# Patient Record
Sex: Female | Born: 1970
Health system: Southern US, Community
[De-identification: ages and names within clinical notes are randomized; demographics above are authoritative.]

## PROBLEM LIST (undated history)

## (undated) DIAGNOSIS — N920 Excessive and frequent menstruation with regular cycle: Secondary | ICD-10-CM

## (undated) DIAGNOSIS — R002 Palpitations: Secondary | ICD-10-CM

## (undated) DIAGNOSIS — Z862 Personal history of diseases of the blood and blood-forming organs and certain disorders involving the immune mechanism: Secondary | ICD-10-CM

## (undated) DIAGNOSIS — E66812 Obesity, class 2: Secondary | ICD-10-CM

## (undated) DIAGNOSIS — F32A Depression, unspecified: Secondary | ICD-10-CM

## (undated) DIAGNOSIS — E669 Obesity, unspecified: Secondary | ICD-10-CM

## (undated) DIAGNOSIS — R7303 Prediabetes: Secondary | ICD-10-CM

## (undated) DIAGNOSIS — H6981 Other specified disorders of Eustachian tube, right ear: Secondary | ICD-10-CM

## (undated) DIAGNOSIS — F329 Major depressive disorder, single episode, unspecified: Secondary | ICD-10-CM

## (undated) DIAGNOSIS — D1803 Hemangioma of intra-abdominal structures: Secondary | ICD-10-CM

## (undated) DIAGNOSIS — F419 Anxiety disorder, unspecified: Secondary | ICD-10-CM

## (undated) HISTORY — DX: Major depressive disorder, single episode, unspecified: F32.9

## (undated) HISTORY — DX: Anxiety disorder, unspecified: F41.9

## (undated) HISTORY — DX: Other specified disorders of eustachian tube, right ear: H69.81

## (undated) HISTORY — DX: Obesity, unspecified: E66.9

## (undated) HISTORY — PX: COLONOSCOPY: SHX174

## (undated) HISTORY — DX: Personal history of diseases of the blood and blood-forming organs and certain disorders involving the immune mechanism: Z86.2

## (undated) HISTORY — DX: Excessive and frequent menstruation with regular cycle: N92.0

## (undated) HISTORY — DX: Hemangioma of intra-abdominal structures: D18.03

## (undated) HISTORY — DX: Palpitations: R00.2

## (undated) HISTORY — PX: BUNIONECTOMY: SHX129

## (undated) HISTORY — DX: Prediabetes: R73.03

## (undated) HISTORY — DX: Depression, unspecified: F32.A

## (undated) HISTORY — DX: Obesity, class 2: E66.812

## (undated) HISTORY — PX: POLYPECTOMY: SHX149

---
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
## 1997-10-03 ENCOUNTER — Other Ambulatory Visit: Admission: RE | Admit: 1997-10-03 | Discharge: 1997-10-03 | Payer: Self-pay | Admitting: Obstetrics & Gynecology

## 1997-11-01 ENCOUNTER — Other Ambulatory Visit: Admission: RE | Admit: 1997-11-01 | Discharge: 1997-11-01 | Payer: Self-pay | Admitting: Obstetrics & Gynecology

## 1998-12-09 ENCOUNTER — Inpatient Hospital Stay (HOSPITAL_COMMUNITY): Admission: AD | Admit: 1998-12-09 | Discharge: 1998-12-12 | Payer: Self-pay | Admitting: Obstetrics and Gynecology

## 1998-12-14 ENCOUNTER — Encounter (HOSPITAL_COMMUNITY): Admission: RE | Admit: 1998-12-14 | Discharge: 1999-03-14 | Payer: Self-pay | Admitting: Obstetrics and Gynecology

## 1999-06-10 HISTORY — PX: TUBAL LIGATION: SHX77

## 2000-03-26 ENCOUNTER — Other Ambulatory Visit: Admission: RE | Admit: 2000-03-26 | Discharge: 2000-03-26 | Payer: Self-pay | Admitting: Obstetrics & Gynecology

## 2000-04-14 ENCOUNTER — Ambulatory Visit (HOSPITAL_COMMUNITY): Admission: RE | Admit: 2000-04-14 | Discharge: 2000-04-14 | Payer: Self-pay | Admitting: Obstetrics & Gynecology

## 2002-05-02 ENCOUNTER — Other Ambulatory Visit: Admission: RE | Admit: 2002-05-02 | Discharge: 2002-05-02 | Payer: Self-pay | Admitting: Obstetrics & Gynecology

## 2004-01-22 ENCOUNTER — Other Ambulatory Visit: Admission: RE | Admit: 2004-01-22 | Discharge: 2004-01-22 | Payer: Self-pay | Admitting: Obstetrics & Gynecology

## 2010-12-19 ENCOUNTER — Other Ambulatory Visit: Payer: Self-pay | Admitting: Obstetrics & Gynecology

## 2010-12-19 DIAGNOSIS — R928 Other abnormal and inconclusive findings on diagnostic imaging of breast: Secondary | ICD-10-CM

## 2010-12-23 ENCOUNTER — Ambulatory Visit
Admission: RE | Admit: 2010-12-23 | Discharge: 2010-12-23 | Disposition: A | Discharge status: Home / Self Care | Payer: BC Managed Care – PPO | Source: Ambulatory Visit | Attending: Obstetrics & Gynecology | Admitting: Obstetrics & Gynecology

## 2010-12-23 DIAGNOSIS — R928 Other abnormal and inconclusive findings on diagnostic imaging of breast: Secondary | ICD-10-CM

## 2012-04-06 ENCOUNTER — Ambulatory Visit (INDEPENDENT_AMBULATORY_CARE_PROVIDER_SITE_OTHER): Payer: BC Managed Care – PPO | Admitting: Internal Medicine

## 2012-04-06 ENCOUNTER — Encounter: Payer: Self-pay | Admitting: Internal Medicine

## 2012-04-06 VITALS — BP 114/80 | HR 81 | Temp 98.1°F | Resp 16 | Ht 65.0 in | Wt 183.0 lb

## 2012-04-06 DIAGNOSIS — Z23 Encounter for immunization: Secondary | ICD-10-CM

## 2012-04-06 DIAGNOSIS — M549 Dorsalgia, unspecified: Secondary | ICD-10-CM

## 2012-04-06 DIAGNOSIS — R109 Unspecified abdominal pain: Secondary | ICD-10-CM

## 2012-04-06 DIAGNOSIS — E785 Hyperlipidemia, unspecified: Secondary | ICD-10-CM | POA: Insufficient documentation

## 2012-04-06 LAB — CBC WITH DIFFERENTIAL/PLATELET
Basophils Absolute: 0.1 10*3/uL (ref 0.0–0.1)
Basophils Relative: 1 % (ref 0–1)
Eosinophils Relative: 1 % (ref 0–5)
HCT: 42.7 % (ref 36.0–46.0)
Hemoglobin: 14.8 g/dL (ref 12.0–15.0)
MCH: 32.4 pg (ref 26.0–34.0)
MCHC: 34.7 g/dL (ref 30.0–36.0)
MCV: 93.4 fL (ref 78.0–100.0)
Monocytes Absolute: 0.6 10*3/uL (ref 0.1–1.0)
Monocytes Relative: 7 % (ref 3–12)
Neutro Abs: 4.7 10*3/uL (ref 1.7–7.7)
RDW: 14.5 % (ref 11.5–15.5)

## 2012-04-06 LAB — BASIC METABOLIC PANEL
BUN: 10 mg/dL (ref 6–23)
Creat: 0.94 mg/dL (ref 0.50–1.10)
Glucose, Bld: 83 mg/dL (ref 70–99)
Potassium: 4.8 mEq/L (ref 3.5–5.3)

## 2012-04-06 LAB — LIPID PANEL
Cholesterol: 225 mg/dL — ABNORMAL HIGH (ref 0–200)
Total CHOL/HDL Ratio: 4.5 Ratio
Triglycerides: 161 mg/dL — ABNORMAL HIGH (ref <150)

## 2012-04-06 LAB — HEPATIC FUNCTION PANEL
ALT: 16 U/L (ref 0–35)
AST: 18 U/L (ref 0–37)
Albumin: 4.5 g/dL (ref 3.5–5.2)
Bilirubin, Direct: 0.1 mg/dL (ref 0.0–0.3)
Total Bilirubin: 0.5 mg/dL (ref 0.3–1.2)

## 2012-04-06 NOTE — Assessment & Plan Note (Signed)
Unremarkable exam. No radicular symptoms. Pursue GI etiology first

## 2012-04-06 NOTE — Progress Notes (Signed)
  Subjective:    Patient ID: Gabriella Stephens, female    DOB: 1970/08/15, 41 y.o.   MRN: [id number]  HPI patient presents to clinic to establish primary care and for evaluation of multiple medical problems. Notes approximately one month history of abdominal pain that she initially thought was related to left low thoracic vs. upper left lumbar pain. Has no radicular symptoms down the leg. She did in the beginning fillet the pain radiated to the front of the abdomen. Now has more persistent left abdominal pain both periumbilical and upper. No radiating abdominal pain nausea vomiting or change in bowel habits. Believes the pain may become worse with food. Taking no medication for the problem. Denies true GERD symptoms or dysphagia.  Recalls a past history of hyperlipidemia last checked approximately 2 years ago. Has not required medication for this. States she obtains her mammograms through gynecology and is due for both followups. States will call and make her appointment. No other complaints.  History reviewed. No pertinent past medical history. Past Surgical History  Procedure Date  . Cesarean section x 2  . Bunionectomy     right foot  . Tonsillectomy     as a child    reports that she has been smoking Cigarettes.  She has a 20 pack-year smoking history. She has never used smokeless tobacco. She reports that she drinks alcohol. Her drug history not on file. family history includes Cancer in her mother; Diabetes in her mother; Heart disease in her mother; Hyperlipidemia in her father and mother; Hypertension in her father and mother; and Thyroid disease in her mother.  There is no history of Kidney disease, and Asthma, and Stroke, . Allergies  Allergen Reactions  . Penicillins Hives     Review of Systems  Gastrointestinal: Positive for abdominal pain. Negative for nausea and vomiting.  Musculoskeletal: Positive for back pain.  All other systems reviewed and are negative.         Objective:   Physical Exam  Nursing note and vitals reviewed. Constitutional: She appears well-developed and well-nourished. No distress.  HENT:  Head: Normocephalic and atraumatic.  Right Ear: External ear normal.  Left Ear: External ear normal.  Nose: Nose normal.  Mouth/Throat: Oropharynx is clear and moist. No oropharyngeal exudate.  Eyes: Conjunctivae normal and EOM are normal. Pupils are equal, round, and reactive to light. No scleral icterus.  Neck: Neck supple. Carotid bruit is not present. No thyromegaly present.  Cardiovascular: Normal rate, regular rhythm and normal heart sounds.  Exam reveals no gallop and no friction rub.   No murmur heard. Pulmonary/Chest: Effort normal and breath sounds normal. No respiratory distress. She has no wheezes. She has no rales.  Abdominal: Soft. Normal appearance and bowel sounds are normal. She exhibits no distension and no mass. There is no hepatosplenomegaly. There is tenderness in the epigastric area, periumbilical area and left upper quadrant. There is no rebound and no guarding.  Lymphadenopathy:    She has no cervical adenopathy.  Neurological: She is alert.  Skin: Skin is warm and dry. She is not diaphoretic.  Psychiatric: She has a normal mood and affect.  MSK: no midline ls tenderness/bony abn or paraspinal muscle spasm/tenderness. Gait nl        Assessment & Plan:

## 2012-04-06 NOTE — Assessment & Plan Note (Signed)
Obtain fasting lipid profile 

## 2012-04-06 NOTE — Assessment & Plan Note (Signed)
With associated tenderness. Worsened postprandially. History exam suggests gastric etiology. Obtain CBC, Chem-7, amylase, LFT and urinalysis. Begin samples of Nexium 40 mg daily. Followup in 3 weeks or sooner if necessary.

## 2012-04-07 LAB — URINALYSIS, ROUTINE W REFLEX MICROSCOPIC
Hgb urine dipstick: NEGATIVE
Ketones, ur: NEGATIVE mg/dL
Nitrite: NEGATIVE
Urobilinogen, UA: 0.2 mg/dL (ref 0.0–1.0)
pH: 7 (ref 5.0–8.0)

## 2012-04-14 ENCOUNTER — Telehealth: Payer: Self-pay | Admitting: *Deleted

## 2012-04-14 NOTE — Telephone Encounter (Signed)
Didn't notify because has f/u appt coming up. Regular labs were nl. Chol was mildly high. Low fat diet/exercise. Will go over at f/u

## 2012-04-14 NOTE — Telephone Encounter (Signed)
Pt requesting results on labs/SLS

## 2012-04-15 NOTE — Telephone Encounter (Signed)
LMOM with contact name & number RE: results & provider instructions/SLS

## 2012-04-29 ENCOUNTER — Encounter: Payer: Self-pay | Admitting: Internal Medicine

## 2012-04-29 ENCOUNTER — Ambulatory Visit (INDEPENDENT_AMBULATORY_CARE_PROVIDER_SITE_OTHER): Payer: BC Managed Care – PPO | Admitting: Internal Medicine

## 2012-04-29 VITALS — BP 116/76 | HR 76 | Temp 98.2°F | Resp 16 | Wt 185.0 lb

## 2012-04-29 DIAGNOSIS — Z720 Tobacco use: Secondary | ICD-10-CM | POA: Insufficient documentation

## 2012-04-29 DIAGNOSIS — E785 Hyperlipidemia, unspecified: Secondary | ICD-10-CM

## 2012-04-29 DIAGNOSIS — R52 Pain, unspecified: Secondary | ICD-10-CM

## 2012-04-29 DIAGNOSIS — R1013 Epigastric pain: Secondary | ICD-10-CM

## 2012-04-29 DIAGNOSIS — R109 Unspecified abdominal pain: Secondary | ICD-10-CM

## 2012-04-29 DIAGNOSIS — F17200 Nicotine dependence, unspecified, uncomplicated: Secondary | ICD-10-CM

## 2012-04-29 MED ORDER — VARENICLINE TARTRATE 1 MG PO TABS: 1.0000 mg | ORAL_TABLET | Freq: Two times a day (BID) | ORAL | Status: DC

## 2012-04-29 MED ORDER — VARENICLINE TARTRATE 0.5 MG X 11 & 1 MG X 42 PO MISC: ORAL | Status: DC

## 2012-04-29 NOTE — Assessment & Plan Note (Signed)
Extend course of Nexium 40 mg a day-samples provided for an additional 15 days. Has demonstrated some improvement but no resolution of pain or tenderness after one month of PPI. Proceed with GI consultation for consideration of EGD.

## 2012-04-29 NOTE — Assessment & Plan Note (Signed)
Ready to proceed with cessation. Attempt Chantix.

## 2012-04-29 NOTE — Assessment & Plan Note (Signed)
Recommend low-fat diet and regular aerobic exercise. Repeat lipid profile with next visit

## 2012-04-29 NOTE — Progress Notes (Signed)
  Subjective:    Patient ID: Gabriella Stephens, female    DOB: 1971/03/19, 41 y.o.   MRN: [id number]  HPI Pt presents to clinic for followup of multiple medical problems. Abdominal pain has improved with Nexium but not resolved. Pain is less severe and not constant. Tolerating Nexium samples without difficulty. Current tobacco user but wishes cessation. Previously tolerated Chantix. Has work wellness form to be completed. Reviewed mildly elevated cholesterol.  No past medical history on file. Past Surgical History  Procedure Date  . Cesarean section x 2  . Bunionectomy     right foot  . Tonsillectomy     as a child    reports that she has been smoking Cigarettes.  She has a 20 pack-year smoking history. She has never used smokeless tobacco. She reports that she drinks alcohol. Her drug history not on file. family history includes Cancer in her mother; Diabetes in her mother; Heart disease in her mother; Hyperlipidemia in her father and mother; Hypertension in her father and mother; and Thyroid disease in her mother.  There is no history of Kidney disease, and Asthma, and Stroke, . Allergies  Allergen Reactions  . Penicillins Hives      Review of Systems see hpi     Objective:   Physical Exam  Nursing note and vitals reviewed. Constitutional: She appears well-developed and well-nourished. No distress.  HENT:  Head: Normocephalic and atraumatic.  Abdominal: Soft. Bowel sounds are normal. She exhibits no distension and no mass. There is tenderness. There is no rebound and no guarding.       Mild discomfort with palpation epigastric and left upper quadrant.  Skin: She is not diaphoretic.          Assessment & Plan:

## 2012-04-29 NOTE — Patient Instructions (Signed)
Please schedule fasting labs prior to next visit Lipid-272.4 

## 2012-05-13 ENCOUNTER — Encounter: Payer: Self-pay | Admitting: Internal Medicine

## 2012-05-27 ENCOUNTER — Ambulatory Visit (INDEPENDENT_AMBULATORY_CARE_PROVIDER_SITE_OTHER): Payer: BC Managed Care – PPO | Admitting: Internal Medicine

## 2012-05-27 ENCOUNTER — Encounter: Payer: Self-pay | Admitting: Internal Medicine

## 2012-05-27 VITALS — BP 128/78 | HR 87 | Wt 191.2 lb

## 2012-05-27 DIAGNOSIS — R1013 Epigastric pain: Secondary | ICD-10-CM

## 2012-05-27 NOTE — Patient Instructions (Addendum)
You have been scheduled for an endoscopy with propofol. Please follow written instructions given to you at your visit today. If you use inhalers (even only as needed) or a CPAP machine, please bring them with you on the day of your procedure.  You have been scheduled for an abdominal ultrasound at [facility] (1st floor of hospital) on 05/31/2012 at 3:00. Please arrive 15 minutes prior to your appointment for registration. Make certain not to have anything to eat or drink 6 hours prior to your appointment. Should you need to reschedule your appointment, please contact radiology at [phone]. This test typically takes about 30 minutes to perform.

## 2012-05-27 NOTE — Progress Notes (Signed)
Patient ID: Gabriella Stephens, female   DOB: 11-18-1970, 41 y.o.   MRN: [id number]  SUBJECTIVE: HPI Gabriella Stephens is a 41 year old female with little past medical history who seen in consultation at the request of Dr. Rodena Medin for evaluation of epigastric abdominal pain. The patient states that her epigastric pain started around September 2013. Initially she was given a prescription for Nexium which seems to help the pain, but then it seemed to lose its efficacy. The pain migrated slightly more inferior to just above her umbilicus. This feels more like an 8 to her but is tolerable. She feels that it is worse with eating, usually within 15-20 minutes. She's not having nocturnal symptoms. She denies heartburn, dysphagia or odynophagia. She has noticed increase in belching. Her appetite has been good without early satiety. Bowel habits have been normal for her which is approximately every other day, formed and brown. No rectal bleeding or melena. No diarrhea.  She has recently stopped smoking as of 05/10/2012, having smoked on and off for 20 years. She was taking Chantix but this seemed to worsen her abdominal pain, and she discontinued it.  Review of Systems  As per history of present illness, otherwise negative   History reviewed. No pertinent past medical history.  No current outpatient prescriptions on file.    Allergies  Allergen Reactions  . Penicillins Hives    Family History  Problem Relation Age of Onset  . Cancer Mother     breast  . Thyroid disease Mother   . Hypertension Mother   . Diabetes Mother   . Heart disease Mother   . Hyperlipidemia Mother   . Hypertension Father   . Hyperlipidemia Father   . Kidney disease Neg Hx   . Asthma Neg Hx   . Stroke Neg Hx     History  Substance Use Topics  . Smoking status: Former Smoker -- 1.0 packs/day for 20 years    Types: Cigarettes  . Smokeless tobacco: Never Used  . Alcohol Use: No     Comment: seldom; once a month     OBJECTIVE: BP 128/78  Pulse 87  Wt 191 lb 3.2 oz (86.728 kg)  SpO2 98%  LMP 04/28/2012 Constitutional: Well-developed and well-nourished. No distress. HEENT: Normocephalic and atraumatic. Oropharynx is clear and moist. No oropharyngeal exudate. Conjunctivae are normal. No scleral icterus. Neck: Neck supple. Trachea midline. Cardiovascular: Normal rate, regular rhythm and intact distal pulses. No M/R/G Pulmonary/chest: Effort normal and breath sounds normal. No wheezing, rales or rhonchi. Abdominal: Soft, mild epigastric tenderness without rebound or guarding, nondistended. Bowel sounds active throughout. There are no masses palpable. No hepatosplenomegaly. Extremities: no clubbing, cyanosis, or edema Lymphadenopathy: No cervical adenopathy noted. Neurological: Alert and oriented to person place and time. Skin: Skin is warm and dry. No rashes noted. Psychiatric: Normal mood and affect. Behavior is normal.  Labs and Imaging -- CBC    Component Value Date/Time   WBC 7.9 04/06/2012 0853   RBC 4.57 04/06/2012 0853   HGB 14.8 04/06/2012 0853   HCT 42.7 04/06/2012 0853   PLT 394 04/06/2012 0853   MCV 93.4 04/06/2012 0853   MCH 32.4 04/06/2012 0853   MCHC 34.7 04/06/2012 0853   RDW 14.5 04/06/2012 0853   LYMPHSABS 2.5 04/06/2012 0853   MONOABS 0.6 04/06/2012 0853   EOSABS 0.1 04/06/2012 0853   BASOSABS 0.1 04/06/2012 0853    CMP     Component Value Date/Time   NA 140 04/06/2012 0853   K 4.8  04/06/2012 0853   CL 103 04/06/2012 0853   CO2 27 04/06/2012 0853   GLUCOSE 83 04/06/2012 0853   BUN 10 04/06/2012 0853   CREATININE 0.94 04/06/2012 0853   CALCIUM 9.7 04/06/2012 0853   PROT 7.5 04/06/2012 0853   ALBUMIN 4.5 04/06/2012 0853   AST 18 04/06/2012 0853   ALT 16 04/06/2012 0853   ALKPHOS 54 04/06/2012 0853   BILITOT 0.5 04/06/2012 0853   Amylase    Component Value Date/Time   AMYLASE 34 04/06/2012 0853    ASSESSMENT AND PLAN: 41 year old female with little  past medical history who seen in consultation at the request of Dr. Rodena Medin for evaluation of epigastric abdominal pain and mid abdominal pain.  1.  Epigastric and mid abdominal pain -- the patient's pain has been ongoing now for several months in the differential includes acid peptic disease such as ulcers or gastritis duodenitis, biliary disease, and less likely celiac disease.  Her symptoms seem primarily upper and she's had no change in her bowel habits or alarming lower GI symptoms.  For now I recommend abdominal ultrasound to better evaluate the gallbladder.  I've also recommended upper endoscopy for direct visualization and to exclude ulcer disease and H. pylori infection.  She was offered medications to help with pain, which she declines. If her pains worsens then she can be offered tramadol, while the diagnostic workup is ongoing. Time was provided for questions and answers, and she is agreeable to this plan.

## 2012-05-31 ENCOUNTER — Ambulatory Visit (AMBULATORY_SURGERY_CENTER): Payer: BC Managed Care – PPO | Admitting: Internal Medicine

## 2012-05-31 ENCOUNTER — Ambulatory Visit (HOSPITAL_COMMUNITY)
Admission: RE | Admit: 2012-05-31 | Discharge: 2012-05-31 | Disposition: A | Discharge status: Home / Self Care | Payer: BC Managed Care – PPO | Source: Ambulatory Visit | Attending: Internal Medicine | Admitting: Internal Medicine

## 2012-05-31 ENCOUNTER — Encounter: Payer: Self-pay | Admitting: Internal Medicine

## 2012-05-31 VITALS — BP 125/78 | HR 65 | Temp 98.7°F | Resp 24 | Ht 65.0 in | Wt 191.0 lb

## 2012-05-31 DIAGNOSIS — D131 Benign neoplasm of stomach: Secondary | ICD-10-CM

## 2012-05-31 DIAGNOSIS — R1013 Epigastric pain: Secondary | ICD-10-CM | POA: Insufficient documentation

## 2012-05-31 MED ORDER — SODIUM CHLORIDE 0.9 % IV SOLN: 500.0000 mL | INTRAVENOUS | Status: DC

## 2012-05-31 MED ORDER — PANTOPRAZOLE SODIUM 40 MG PO TBEC: 40.0000 mg | DELAYED_RELEASE_TABLET | Freq: Every day | ORAL | Status: DC

## 2012-05-31 NOTE — Progress Notes (Signed)
Called to room to assist during endoscopic procedure.  Patient ID and intended procedure confirmed with present staff. Received instructions for my participation in the procedure from the performing physician.  

## 2012-05-31 NOTE — Progress Notes (Signed)
Patient did not have preoperative order for IV antibiotic SSI prophylaxis. (G8918)  Patient did not experience any of the following events: a burn prior to discharge; a fall within the facility; wrong site/side/patient/procedure/implant event; or a hospital transfer or hospital admission upon discharge from the facility. (G8907)  

## 2012-05-31 NOTE — Patient Instructions (Addendum)
YOU HAD AN ENDOSCOPIC PROCEDURE TODAY AT THE [facility]: Refer to the procedure report that was given to you for any specific questions about what was found during the examination.  If the procedure report does not answer your questions, please call your gastroenterologist to clarify.  If you requested that your care partner not be given the details of your procedure findings, then the procedure report has been included in a sealed envelope for you to review at your convenience later.  YOU SHOULD EXPECT: Some feelings of bloating in the abdomen. Passage of more gas than usual.  Walking can help get rid of the air that was put into your GI tract during the procedure and reduce the bloating. If you had a lower endoscopy (such as a colonoscopy or flexible sigmoidoscopy) you may notice spotting of blood in your stool or on the toilet paper. If you underwent a bowel prep for your procedure, then you may not have a normal bowel movement for a few days.  DIET: Your first meal following the procedure should be a light meal and then it is ok to progress to your normal diet.  A half-sandwich or bowl of soup is an example of a good first meal.  Heavy or fried foods are harder to digest and may make you feel nauseous or bloated.  Likewise meals heavy in dairy and vegetables can cause extra gas to form and this can also increase the bloating.  Drink plenty of fluids but you should avoid alcoholic beverages for 24 hours.  ACTIVITY: Your care partner should take you home directly after the procedure.  You should plan to take it easy, moving slowly for the rest of the day.  You can resume normal activity the day after the procedure however you should NOT DRIVE or use heavy machinery for 24 hours (because of the sedation medicines used during the test).    SYMPTOMS TO REPORT IMMEDIATELY: A gastroenterologist can be reached at any hour.  During normal business hours, 8:30 AM to 5:00 PM Monday through Friday,  call [phone].  After hours and on weekends, please call the GI answering service at [phone] who will take a message and have the physician on call contact you.   Following lower endoscopy (colonoscopy or flexible sigmoidoscopy):  Excessive amounts of blood in the stool  Significant tenderness or worsening of abdominal pains  Swelling of the abdomen that is new, acute  Fever of 100F or higher  Following upper endoscopy (EGD)  Vomiting of blood or coffee ground material  New chest pain or pain under the shoulder blades  Painful or persistently difficult swallowing  New shortness of breath  Fever of 100F or higher  Black, tarry-looking stools  FOLLOW UP: If any biopsies were taken you will be contacted by phone or by letter within the next 1-3 weeks.  Call your gastroenterologist if you have not heard about the biopsies in 3 weeks.  Our staff will call the home number listed on your records the next business day following your procedure to check on you and address any questions or concerns that you may have at that time regarding the information given to you following your procedure. This is a courtesy call and so if there is no answer at the home number and we have not heard from you through the emergency physician on call, we will assume that you have returned to your regular daily activities without incident.  SIGNATURES/CONFIDENTIALITY: You and/or your care   partner have signed paperwork which will be entered into your electronic medical record.  These signatures attest to the fact that that the information above on your After Visit Summary has been reviewed and is understood.  Full responsibility of the confidentiality of this discharge information lies with you and/or your care-partner.  

## 2012-05-31 NOTE — Op Note (Signed)
[facility] [address] [place], [address]   ENDOSCOPY PROCEDURE REPORT  PATIENT: Gabriella, Stephens  MR#: [id number] BIRTHDATE: 15-Mar-1971 , 41  yrs. old GENDER: Female ENDOSCOPIST: Beverley Fiedler, MD REFERRED BY:  Charlynn Court PROCEDURE DATE:  05/31/2012 PROCEDURE:  EGD w/ biopsy for H.pylori ASA CLASS:     Class II INDICATIONS:  Epigastric pain. MEDICATIONS: MAC sedation, administered by CRNA and propofol (Diprivan) 300mg  IV TOPICAL ANESTHETIC: Cetacaine Spray  DESCRIPTION OF PROCEDURE: After the risks benefits and alternatives of the procedure were thoroughly explained, informed consent was obtained.  The LB-GIF Q180 [id number] endoscope was introduced through the mouth and advanced to the second portion of the duodenum. Without limitations.  The instrument was slowly withdrawn as the mucosa was fully examined.    ESOPHAGUS: The mucosa of the esophagus appeared normal.  STOMACH: There was mild antral gastropathy noted.  Cold forcep biopsies were taken at the antrum and angularis.   Otherwise normal appearing gastric mucosa.  DUODENUM: Mild duodenal inflammation was found in the duodenal bulb. The duodenal mucosa showed no abnormalities in the 2nd part of the duodenum. Retroflexed views revealed no abnormalities.     The scope was then withdrawn from the patient and the procedure completed.  COMPLICATIONS: There were no complications. ENDOSCOPIC IMPRESSION: 1.   The mucosa of the esophagus appeared normal 2.   There was mild antral gastropathy noted 3.   Mild duodenal inflammation was found in the duodenal bulb 4.   The duodenal mucosa showed no abnormalities in the 2nd part of the duodenum  RECOMMENDATIONS: 1.  Await biopsy results 2.  Follow-up of helicobacter pylori status, treat if indicated 3.  Trial of pantoprazole 40 mg daily, best taken 30 minutes before your first meal 4.  Proceed with previously order abdominal ultrasound  eSigned:   Beverley Fiedler, MD 05/31/2012 12:04 PMrevised

## 2012-05-31 NOTE — Progress Notes (Signed)
No egg or soy allergy. ewm 

## 2012-06-01 ENCOUNTER — Telehealth: Payer: Self-pay | Admitting: *Deleted

## 2012-06-01 NOTE — Telephone Encounter (Signed)
  Follow up Call-  Call back number 05/31/2012  Post procedure Call Back phone  # [phone]  Permission to leave phone message Yes     Patient questions:  Do you have a fever, pain , or abdominal swelling? no Pain Score  0 *  Have you tolerated food without any problems? yes  Have you been able to return to your normal activities? yes  Do you have any questions about your discharge instructions: Diet   no Medications  no Follow up visit  no  Do you have questions or concerns about your Care? no  Actions: * If pain score is 4 or above: No action needed, pain <4.

## 2012-06-10 ENCOUNTER — Encounter: Payer: Self-pay | Admitting: Internal Medicine

## 2012-08-24 ENCOUNTER — Ambulatory Visit: Payer: BC Managed Care – PPO | Admitting: Internal Medicine

## 2012-10-18 ENCOUNTER — Telehealth: Payer: Self-pay | Admitting: *Deleted

## 2012-10-18 NOTE — Telephone Encounter (Signed)
Message copied by Florene Glen on Mon Oct 18, 2012 10:36 AM ------      Message from: Selinda Michaels R      Created: Tue Jun 01, 2012  9:13 AM      Regarding: Repeat abdominal ultrasound       Hemangioma-needs [place] in May or June ------

## 2012-10-18 NOTE — Telephone Encounter (Signed)
lmom for pt to call back

## 2012-11-08 NOTE — Telephone Encounter (Signed)
lmom for pt to call back

## 2012-11-11 NOTE — Telephone Encounter (Signed)
Pt never called back. Mailed her a letter asking her to contact us to schedule an U/S.

## 2012-12-07 ENCOUNTER — Telehealth: Payer: Self-pay | Admitting: *Deleted

## 2012-12-07 NOTE — Telephone Encounter (Signed)
Message copied by Florene Glen on Tue Dec 07, 2012  9:04 AM ------      Message from: Florene Glen      Created: Thu Nov 11, 2012 10:51 AM       See if pt called to schedule u/s ------

## 2012-12-13 NOTE — Telephone Encounter (Signed)
Mailed pt a letter on 11/11/12 and have not heard from pt nor has she scheduled an U/S.

## 2012-12-27 NOTE — Telephone Encounter (Signed)
Pt never called back.

## 2013-04-14 ENCOUNTER — Other Ambulatory Visit: Payer: Self-pay

## 2014-03-13 ENCOUNTER — Other Ambulatory Visit: Payer: Self-pay | Admitting: Obstetrics & Gynecology

## 2014-03-14 LAB — CYTOLOGY - PAP

## 2014-03-15 ENCOUNTER — Other Ambulatory Visit: Payer: Self-pay | Admitting: Obstetrics & Gynecology

## 2014-03-15 DIAGNOSIS — R928 Other abnormal and inconclusive findings on diagnostic imaging of breast: Secondary | ICD-10-CM

## 2014-03-23 ENCOUNTER — Ambulatory Visit
Admission: RE | Admit: 2014-03-23 | Discharge: 2014-03-23 | Disposition: A | Discharge status: Home / Self Care | Payer: BC Managed Care – PPO | Source: Ambulatory Visit | Attending: Obstetrics & Gynecology | Admitting: Obstetrics & Gynecology

## 2014-03-23 DIAGNOSIS — R928 Other abnormal and inconclusive findings on diagnostic imaging of breast: Secondary | ICD-10-CM

## 2015-04-24 ENCOUNTER — Encounter: Payer: Self-pay | Admitting: Family Medicine

## 2015-04-24 ENCOUNTER — Ambulatory Visit (INDEPENDENT_AMBULATORY_CARE_PROVIDER_SITE_OTHER): Payer: BLUE CROSS/BLUE SHIELD | Admitting: Family Medicine

## 2015-04-24 VITALS — BP 136/81 | HR 74 | Temp 98.2°F | Resp 16 | Ht 62.25 in | Wt 206.0 lb

## 2015-04-24 DIAGNOSIS — Z23 Encounter for immunization: Secondary | ICD-10-CM | POA: Diagnosis not present

## 2015-04-24 DIAGNOSIS — Z0000 Encounter for general adult medical examination without abnormal findings: Secondary | ICD-10-CM

## 2015-04-24 DIAGNOSIS — D229 Melanocytic nevi, unspecified: Secondary | ICD-10-CM

## 2015-04-24 LAB — TSH: TSH: 3.21 u[IU]/mL (ref 0.35–4.50)

## 2015-04-24 LAB — CBC WITH DIFFERENTIAL/PLATELET
BASOS PCT: 0.6 % (ref 0.0–3.0)
Basophils Absolute: 0 10*3/uL (ref 0.0–0.1)
EOS PCT: 1.2 % (ref 0.0–5.0)
Eosinophils Absolute: 0.1 10*3/uL (ref 0.0–0.7)
HCT: 41.1 % (ref 36.0–46.0)
Hemoglobin: 13.6 g/dL (ref 12.0–15.0)
LYMPHS ABS: 2.1 10*3/uL (ref 0.7–4.0)
Lymphocytes Relative: 33.7 % (ref 12.0–46.0)
MCHC: 33.2 g/dL (ref 30.0–36.0)
MCV: 93.4 fl (ref 78.0–100.0)
Monocytes Absolute: 0.4 10*3/uL (ref 0.1–1.0)
Monocytes Relative: 6.3 % (ref 3.0–12.0)
NEUTROS PCT: 58.2 % (ref 43.0–77.0)
Neutro Abs: 3.7 10*3/uL (ref 1.4–7.7)
Platelets: 390 10*3/uL (ref 150.0–400.0)
RBC: 4.4 Mil/uL (ref 3.87–5.11)
RDW: 15 % (ref 11.5–15.5)
WBC: 6.3 10*3/uL (ref 4.0–10.5)

## 2015-04-24 LAB — COMPREHENSIVE METABOLIC PANEL
ALT: 16 U/L (ref 0–35)
AST: 16 U/L (ref 0–37)
Albumin: 4.3 g/dL (ref 3.5–5.2)
Alkaline Phosphatase: 56 U/L (ref 39–117)
BUN: 12 mg/dL (ref 6–23)
CO2: 26 meq/L (ref 19–32)
Calcium: 9.5 mg/dL (ref 8.4–10.5)
Chloride: 104 mEq/L (ref 96–112)
Creatinine, Ser: 0.89 mg/dL (ref 0.40–1.20)
GFR: 72.87 mL/min (ref >60.00)
GLUCOSE: 91 mg/dL (ref 70–99)
POTASSIUM: 4.1 meq/L (ref 3.5–5.1)
Sodium: 139 mEq/L (ref 135–145)
Total Bilirubin: 0.5 mg/dL (ref 0.2–1.2)
Total Protein: 7.2 g/dL (ref 6.0–8.3)

## 2015-04-24 LAB — LIPID PANEL
CHOL/HDL RATIO: 4
Cholesterol: 200 mg/dL (ref 0–200)
HDL: 53 mg/dL (ref >39.00)
LDL Cholesterol: 123 mg/dL — ABNORMAL HIGH (ref 0–99)
NONHDL: 147.35
Triglycerides: 121 mg/dL (ref 0.0–149.0)
VLDL: 24.2 mg/dL (ref 0.0–40.0)

## 2015-04-24 NOTE — Progress Notes (Signed)
Office Note 04/24/2015  CC:  Chief Complaint  Patient presents with  . Establish Care  . Annual Exam    Pt is fasting.     HPI:  Gabriella Stephens is a 44 y.o. White female who is here to establish care, get CPE. Patient's most recent primary MD: Dr. Elizebeth Koller at [facility].  Last saw him a couple years ago. Old records were reviewed prior to or during today's visit. She sees GYN, Dr. Stann Mainland, and is due to f/u with him for annual visit soon.  History reviewed. No pertinent past medical history.  Past Surgical History  Procedure Laterality Date  . Cesarean section  x 2    1995; 2000  . Bunionectomy      right foot  . Tonsillectomy  1973    as a child  . Tubal ligation      Family History  Problem Relation Age of Onset  . Thyroid disease Mother   . Hypertension Mother   . Diabetes Mother   . Heart disease Mother   . Hyperlipidemia Mother   . Breast cancer Mother   . Hypertension Father   . Hyperlipidemia Father   . Multiple myeloma Father   . Kidney disease Neg Hx   . Asthma Neg Hx   . Stroke Neg Hx   . Esophageal cancer Neg Hx   . Rectal cancer Neg Hx   . Stomach cancer Neg Hx   . Colon cancer Paternal Grandfather     Social History   Social History  . Marital Status: Single    Spouse Name: N/A  . Number of Children: 3  . Years of Education: N/A   Occupational History  . Manager   .     Social History Main Topics  . Smoking status: Former Smoker -- 1.00 packs/day for 20 years    Types: Cigarettes    Quit date: 06/10/2011  . Smokeless tobacco: Never Used  . Alcohol Use: No     Comment: seldom; once a month  . Drug Use: No  . Sexual Activity: Not on file   Other Topics Concern  . Not on file   Social History Narrative   Married, 3 children.  Lives in [place].   Educ: HS   Occupation: Environmental health practitioner and print room for [employer]   No T/A/Ds.         MEDS: not currently taking either of the meds listed below Outpatient Encounter  Prescriptions as of 04/24/2015  Medication Sig  . phentermine 37.5 MG capsule Take 37.5 mg by mouth every morning. Rx'd by her GYN, Dr. Stann Mainland.  . [DISCONTINUED] pantoprazole (PROTONIX) 40 MG tablet Take 1 tablet (40 mg total) by mouth daily. (Patient not taking: Reported on 04/24/2015)   No facility-administered encounter medications on file as of 04/24/2015.    Allergies  Allergen Reactions  . Penicillins Hives    ROS Review of Systems  Constitutional: Negative for fever, chills, appetite change and fatigue.  HENT: Negative for congestion, dental problem, ear pain and sore throat.   Eyes: Negative for discharge, redness and visual disturbance.  Respiratory: Negative for cough, chest tightness, shortness of breath and wheezing.   Cardiovascular: Negative for chest pain, palpitations and leg swelling.  Gastrointestinal: Negative for nausea, vomiting, abdominal pain, diarrhea and blood in stool.  Genitourinary: Negative for dysuria, urgency, frequency, hematuria, flank pain and difficulty urinating.  Musculoskeletal: Negative for myalgias, back pain, joint swelling, arthralgias and neck stiffness.  Skin: Negative for  pallor and rash.  Neurological: Negative for dizziness, speech difficulty, weakness and headaches.  Hematological: Negative for adenopathy. Does not bruise/bleed easily.  Psychiatric/Behavioral: Negative for confusion and sleep disturbance. The patient is not nervous/anxious.     PE; Blood pressure 136/81, pulse 74, temperature 98.2 F (36.8 C), temperature source Oral, resp. rate 16, height 5' 2.25" (1.581 m), weight 206 lb (93.441 kg), last menstrual period 04/20/2015, SpO2 97 %. Gen: Alert, well appearing.  Patient is oriented to person, place, time, and situation. AFFECT: pleasant, lucid thought and speech. ENT: Ears: EACs clear, normal epithelium.  TMs with good light reflex and landmarks bilaterally.  Eyes: no injection, icteris, swelling, or exudate.  EOMI,  PERRLA. Nose: no drainage or turbinate edema/swelling.  No injection or focal lesion.  Mouth: lips without lesion/swelling.  Oral mucosa pink and moist.  Dentition intact and without obvious caries or gingival swelling.  Oropharynx without erythema, exudate, or swelling.  Neck: supple/nontender.  No LAD, mass, or TM.  Carotid pulses 2+ bilaterally, without bruits. CV: RRR, no m/r/g.   LUNGS: CTA bilat, nonlabored resps, good aeration in all lung fields. ABD: soft, NT, ND, BS normal.  No hepatospenomegaly or mass.  No bruits. EXT: no clubbing, cyanosis, or edema.  Musculoskeletal: no joint swelling, erythema, warmth, or tenderness.  ROM of all joints intact. Skin - no sores or color changes.  She has a 1 cm oval compound nevus in low back region  Pertinent labs:   Lab Results  Component Value Date   WBC 7.9 04/06/2012   HGB 14.8 04/06/2012   HCT 42.7 04/06/2012   MCV 93.4 04/06/2012   PLT 394 04/06/2012   Lab Results  Component Value Date   CREATININE 0.94 04/06/2012   BUN 10 04/06/2012   NA 140 04/06/2012   K 4.8 04/06/2012   CL 103 04/06/2012   CO2 27 04/06/2012   Lab Results  Component Value Date   ALT 16 04/06/2012   AST 18 04/06/2012   ALKPHOS 54 04/06/2012   BILITOT 0.5 04/06/2012   Lab Results  Component Value Date   CHOL 225* 04/06/2012   Lab Results  Component Value Date   HDL 50 04/06/2012   Lab Results  Component Value Date   LDLCALC 143* 04/06/2012   Lab Results  Component Value Date   TRIG 161* 04/06/2012   Lab Results  Component Value Date   CHOLHDL 4.5 04/06/2012    ASSESSMENT AND PLAN:   Transfer pt;  Health maintenance exam: Reviewed age and gender appropriate health maintenance issues (prudent diet, regular exercise, health risks of tobacco and excessive alcohol, use of seatbelts, fire alarms in home, use of sunscreen).  Also reviewed age and gender appropriate health screening as well as vaccine recommendations. Flu vaccine  today. Fasting HP labs drawn today. Referral to dermatologist ordered today due to atypical nevus on low back region.  An After Visit Summary was printed and given to the patient.  Return in about 1 year (around 04/23/2016) for annual CPE (fasting).

## 2015-04-24 NOTE — Progress Notes (Signed)
Pre visit review using our clinic review tool, if applicable. No additional management support is needed unless otherwise documented below in the visit note. 

## 2015-08-16 ENCOUNTER — Encounter: Payer: Self-pay | Admitting: Family Medicine

## 2015-08-16 ENCOUNTER — Ambulatory Visit (INDEPENDENT_AMBULATORY_CARE_PROVIDER_SITE_OTHER): Payer: 59 | Admitting: Family Medicine

## 2015-08-16 VITALS — BP 119/72 | HR 73 | Temp 98.2°F | Resp 20 | Wt 201.2 lb

## 2015-08-16 DIAGNOSIS — F4323 Adjustment disorder with mixed anxiety and depressed mood: Secondary | ICD-10-CM

## 2015-08-16 MED ORDER — DULOXETINE HCL 30 MG PO CPEP: ORAL_CAPSULE | ORAL | Status: DC

## 2015-08-16 NOTE — Progress Notes (Signed)
OFFICE VISIT  08/16/2015   CC:  Chief Complaint  Patient presents with  . Anxiety     HPI:    Patient is a 45 y.o. Caucasian female who presents for "stress".  Then she actually went on to describe pervasive sadness feeling, crying easily. Her mother has cancer and is not doing well lately, the reality of the situation has caught up with her and she feels down/depressed/anxious all the time.  She starts to cry anytime she thinks about the situation now. Denies SI or HI.  Concentration is ok so she is able to work.  +Irritable.  Energy level and appetite are unchanged.  Sleep is choppy but this in not far from her normal.  No panic attacks.  Says she has never been on antidepressant or antianxiety med in past b/c " I'm not a medication type of person".  She doesn't smoke. No signif alcohol.     Past Medical History  Diagnosis Date  . Obesity, Class II, BMI 35-39.9     Past Surgical History  Procedure Laterality Date  . Cesarean section  x 2    1995; 2000  . Bunionectomy      right foot  . Tonsillectomy  1973    as a child  . Tubal ligation      Allergies  Allergen Reactions  . Penicillins Hives    ROS As per HPI  PE: Blood pressure 119/72, pulse 73, temperature 98.2 F (36.8 C), resp. rate 20, weight 201 lb 4 oz (91.286 kg), SpO2 98 %. Wt Readings from Last 2 Encounters:  08/16/15 201 lb 4 oz (91.286 kg)  04/24/15 206 lb (93.441 kg)    Gen: alert, oriented x 4, affect pleasant.  Lucid thinking and conversation noted. HEENT: PERRLA, EOMI.   Neck: no LAD, mass, or thyromegaly. CV: RRR, no m/r/g LUNGS: CTA bilat, nonlabored. NEURO: no tremor or tics noted on observation.  Coordination intact. CN 2-12 grossly intact bilaterally, strength 5/5 in all extremeties.  No ataxia.   LABS:  none  IMPRESSION AND PLAN:  Adjustment reaction with depression>anxiety symptoms. Discussed options with pt and she wants medication to try to help this. Started duloxetine 30mg   qd x 7d, then increase to 60mg  qd. Therapeutic expectations and side effect profile of medication discussed today.  Patient's questions answered.  An After Visit Summary was printed and given to the patient.  FOLLOW UP: Return in about 4 weeks (around 09/13/2015) for f/u mood/grief.

## 2015-09-10 ENCOUNTER — Other Ambulatory Visit: Payer: Self-pay | Admitting: *Deleted

## 2015-09-10 MED ORDER — DULOXETINE HCL 60 MG PO CPEP
60.0000 mg | ORAL_CAPSULE | Freq: Every day | ORAL | Status: DC
Start: 2015-09-10 — End: 2016-04-24

## 2015-09-10 NOTE — Telephone Encounter (Signed)
RF request for duloxetine LOV: 08/16/15 Next ov: 10/03/15 Last written: 08/16/15 #53 w/ 0RF

## 2015-09-25 ENCOUNTER — Other Ambulatory Visit: Payer: Self-pay | Admitting: Obstetrics & Gynecology

## 2015-09-26 LAB — CYTOLOGY - PAP

## 2015-10-03 ENCOUNTER — Ambulatory Visit: Payer: 59 | Admitting: Family Medicine

## 2016-04-24 ENCOUNTER — Encounter: Payer: Self-pay | Admitting: Family Medicine

## 2016-04-24 ENCOUNTER — Ambulatory Visit (INDEPENDENT_AMBULATORY_CARE_PROVIDER_SITE_OTHER): Payer: 59 | Admitting: Family Medicine

## 2016-04-24 VITALS — BP 138/88 | HR 72 | Temp 98.4°F | Resp 16 | Ht 63.0 in | Wt 212.4 lb

## 2016-04-24 DIAGNOSIS — F419 Anxiety disorder, unspecified: Secondary | ICD-10-CM

## 2016-04-24 DIAGNOSIS — F329 Major depressive disorder, single episode, unspecified: Secondary | ICD-10-CM

## 2016-04-24 DIAGNOSIS — Z23 Encounter for immunization: Secondary | ICD-10-CM

## 2016-04-24 DIAGNOSIS — F418 Other specified anxiety disorders: Secondary | ICD-10-CM

## 2016-04-24 DIAGNOSIS — Z0000 Encounter for general adult medical examination without abnormal findings: Secondary | ICD-10-CM

## 2016-04-24 MED ORDER — DULOXETINE HCL 60 MG PO CPEP: ORAL_CAPSULE | ORAL | 6 refills | Status: DC

## 2016-04-24 NOTE — Progress Notes (Signed)
Pre visit review using our clinic review tool, if applicable. No additional management support is needed unless otherwise documented below in the visit note. 

## 2016-04-24 NOTE — Addendum Note (Signed)
Addended by: Gordy Councilman on: 04/24/2016 09:15 AM   Modules accepted: Orders

## 2016-04-24 NOTE — Progress Notes (Signed)
Office Note 04/24/2016  CC:  Chief Complaint  Patient presents with  . Annual Exam    CPE    HPI:  Gabriella Stephens is a 45 y.o. White female who is here for annual health maintenance exam. GYN MD is Dr. Stann Mainland.  Pap smear and mammogram are UTD. Recent wellness labs via work showed lipid panel normal and fasting glucose normal.  BP normal. We decided to do no further labs today.  These labs will be scanned into pt's chart.  Anxiety: we started duloxetine 08/2015 and she took it for 2 mo and found it helpful.  She then stopped it "b/c I'm not a big medicine person".  However, she wants to restart this med now.     Past Medical History:  Diagnosis Date  . Obesity, Class II, BMI 35-39.9     Past Surgical History:  Procedure Laterality Date  . BUNIONECTOMY     right foot  . CESAREAN SECTION  x 2   1995; 2000  . TONSILLECTOMY  1973   as a child  . TUBAL LIGATION      Family History  Problem Relation Age of Onset  . Thyroid disease Mother   . Hypertension Mother   . Diabetes Mother   . Heart disease Mother   . Hyperlipidemia Mother   . Breast cancer Mother   . Hypertension Father   . Hyperlipidemia Father   . Multiple myeloma Father   . Colon cancer Paternal Grandfather   . Kidney disease Neg Hx   . Asthma Neg Hx   . Stroke Neg Hx   . Esophageal cancer Neg Hx   . Rectal cancer Neg Hx   . Stomach cancer Neg Hx     Social History   Social History  . Marital status: Single    Spouse name: N/A  . Number of children: 3  . Years of education: N/A   Occupational History  . 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  [employer]   Social History Main Topics  . Smoking status: Former Smoker    Packs/day: 1.00    Years: 20.00    Types: Cigarettes    Quit date: 06/10/2011  . Smokeless tobacco: Never Used  . Alcohol use No     Comment: seldom; once a month  . Drug use: No  . Sexual activity: Not on file   Other Topics Concern  . Not on file   Social History Narrative   Married, 3  children.  Lives in [place].   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   No T/A/Ds.         MED: not currently taking cymbalta Outpatient Medications Prior to Visit  Medication Sig Dispense Refill  . DULoxetine (CYMBALTA) 60 MG capsule Take 1 capsule (60 mg total) by mouth daily. 1 cap po qd x 7d, then 2 caps po qd (Patient not taking: Reported on 04/24/2016) 30 capsule 3   No facility-administered medications prior to visit.     Allergies  Allergen Reactions  . Penicillins Hives    ROS Review of Systems  Constitutional: Negative for appetite change, chills, fatigue and fever.  HENT: Negative for congestion, dental problem, ear pain and sore throat.   Eyes: Negative for discharge, redness and visual disturbance.  Respiratory: Negative for cough, chest tightness, shortness of breath and wheezing.   Cardiovascular: Negative for chest pain, palpitations and leg swelling.  Gastrointestinal: Negative for abdominal pain, blood in stool, diarrhea,  nausea and vomiting.  Genitourinary: Negative for difficulty urinating, dysuria, flank pain, frequency, hematuria and urgency.  Musculoskeletal: Negative for arthralgias, back pain, joint swelling, myalgias and neck stiffness.  Skin: Negative for pallor and rash.  Neurological: Negative for dizziness, speech difficulty, weakness and headaches.  Hematological: Negative for adenopathy. Does not bruise/bleed easily.  Psychiatric/Behavioral: Positive for dysphoric mood. Negative for confusion and sleep disturbance. The patient is nervous/anxious.     PE; Blood pressure 138/88, pulse 72, temperature 98.4 F (36.9 C), temperature source Temporal, resp. rate 16, height _0  (1.6 m), weight 212 lb 6.4 oz (96.3 kg), last menstrual period 04/02/2016, SpO2 98 %. Gen: Alert, well appearing.  Patient is oriented to person, place, time, and situation. AFFECT: pleasant, lucid thought and speech. ENT: Ears: EACs  clear, normal epithelium.  TMs with good light reflex and landmarks bilaterally.  Eyes: no injection, icteris, swelling, or exudate.  EOMI, PERRLA. Nose: no drainage or turbinate edema/swelling.  No injection or focal lesion.  Mouth: lips without lesion/swelling.  Oral mucosa pink and moist.  Dentition intact and without obvious caries or gingival swelling.  Oropharynx without erythema, exudate, or swelling.  Neck: supple/nontender.  No LAD, mass, or TM.  Carotid pulses 2+ bilaterally, without bruits. CV: RRR, no m/r/g.   LUNGS: CTA bilat, nonlabored resps, good aeration in all lung fields. ABD: soft, NT, ND, BS normal.  No hepatospenomegaly or mass.  No bruits. EXT: no clubbing, cyanosis, or edema.  Musculoskeletal: no joint swelling, erythema, warmth, or tenderness.  ROM of all joints intact. Skin - no sores or suspicious lesions or rashes or color changes  Pertinent labs:  Lab Results  Component Value Date   TSH 3.21 04/24/2015   Lab Results  Component Value Date   WBC 6.3 04/24/2015   HGB 13.6 04/24/2015   HCT 41.1 04/24/2015   MCV 93.4 04/24/2015   PLT 390.0 04/24/2015   Lab Results  Component Value Date   CREATININE 0.89 04/24/2015   BUN 12 04/24/2015   NA 139 04/24/2015   K 4.1 04/24/2015   CL 104 04/24/2015   CO2 26 04/24/2015   Lab Results  Component Value Date   ALT 16 04/24/2015   AST 16 04/24/2015   ALKPHOS 56 04/24/2015   BILITOT 0.5 04/24/2015   Lab Results  Component Value Date   CHOL 200 04/24/2015   Lab Results  Component Value Date   HDL 53.00 04/24/2015   Lab Results  Component Value Date   LDLCALC 123 (H) 04/24/2015   Lab Results  Component Value Date   TRIG 121.0 04/24/2015   Lab Results  Component Value Date   CHOLHDL 4 04/24/2015    ASSESSMENT AND PLAN:   Health maintenance exam: Reviewed age and gender appropriate health maintenance issues (prudent diet, regular exercise, health risks of tobacco and excessive alcohol, use of  seatbelts, fire alarms in home, use of sunscreen).  Also reviewed age and gender appropriate health screening as well as vaccine recommendations. Flu vaccine today. No labs today since she had a normal lipid panel and fasting glucose in October 2017. Cerv and breast ca screening UTD.  Regarding her GAD, recent depression---will restart cymbalta 60 mg qd at pt's request today.  She said she responded well to this med x 2 mo back in March this year.  An After Visit Summary was printed and given to the patient.  FOLLOW UP:  Return in about 6 months (around 10/22/2016) for f/u anx/dep.  Signed:  Abbe Amsterdam  Roxann Vierra, MD           04/24/2016

## 2016-07-14 ENCOUNTER — Encounter: Payer: Self-pay | Admitting: Family Medicine

## 2016-07-14 ENCOUNTER — Ambulatory Visit (INDEPENDENT_AMBULATORY_CARE_PROVIDER_SITE_OTHER): Payer: BLUE CROSS/BLUE SHIELD | Admitting: Family Medicine

## 2016-07-14 ENCOUNTER — Ambulatory Visit (HOSPITAL_BASED_OUTPATIENT_CLINIC_OR_DEPARTMENT_OTHER)
Admission: RE | Admit: 2016-07-14 | Discharge: 2016-07-14 | Disposition: A | Discharge status: Home / Self Care | Payer: BLUE CROSS/BLUE SHIELD | Source: Ambulatory Visit | Attending: Family Medicine | Admitting: Family Medicine

## 2016-07-14 VITALS — BP 117/79 | HR 84 | Temp 99.2°F | Resp 16 | Wt 213.0 lb

## 2016-07-14 DIAGNOSIS — R1010 Upper abdominal pain, unspecified: Secondary | ICD-10-CM

## 2016-07-14 DIAGNOSIS — I700 Atherosclerosis of aorta: Secondary | ICD-10-CM | POA: Diagnosis not present

## 2016-07-14 DIAGNOSIS — K769 Liver disease, unspecified: Secondary | ICD-10-CM | POA: Diagnosis not present

## 2016-07-14 DIAGNOSIS — R110 Nausea: Secondary | ICD-10-CM | POA: Insufficient documentation

## 2016-07-14 DIAGNOSIS — M545 Low back pain: Secondary | ICD-10-CM | POA: Diagnosis not present

## 2016-07-14 DIAGNOSIS — R3129 Other microscopic hematuria: Secondary | ICD-10-CM

## 2016-07-14 DIAGNOSIS — R918 Other nonspecific abnormal finding of lung field: Secondary | ICD-10-CM | POA: Diagnosis not present

## 2016-07-14 LAB — COMPREHENSIVE METABOLIC PANEL
ALT: 14 U/L (ref 6–29)
AST: 19 U/L (ref 10–35)
Albumin: 3.9 g/dL (ref 3.6–5.1)
Alkaline Phosphatase: 52 U/L (ref 33–115)
BUN: 12 mg/dL (ref 7–25)
CO2: 27 mmol/L (ref 20–31)
Calcium: 8.3 mg/dL — ABNORMAL LOW (ref 8.6–10.2)
Chloride: 103 mmol/L (ref 98–110)
Creat: 0.92 mg/dL (ref 0.50–1.10)
GLUCOSE: 98 mg/dL (ref 65–99)
Potassium: 3.8 mmol/L (ref 3.5–5.3)
Sodium: 138 mmol/L (ref 135–146)
Total Bilirubin: 0.6 mg/dL (ref 0.2–1.2)
Total Protein: 6.9 g/dL (ref 6.1–8.1)

## 2016-07-14 LAB — POC URINALSYSI DIPSTICK (AUTOMATED)
Glucose, UA: NEGATIVE
Ketones, UA: NEGATIVE
Leukocytes, UA: NEGATIVE
Nitrite, UA: NEGATIVE
PH UA: 5.5
Spec Grav, UA: 1.025
Urobilinogen, UA: 4

## 2016-07-14 LAB — CBC WITH DIFFERENTIAL/PLATELET
BASOS PCT: 0 %
Basophils Absolute: 0 cells/uL (ref 0–200)
Eosinophils Absolute: 0 cells/uL — ABNORMAL LOW (ref 15–500)
Eosinophils Relative: 0 %
HCT: 41.3 % (ref 35.0–45.0)
Hemoglobin: 13.7 g/dL (ref 11.7–15.5)
Lymphocytes Relative: 16 %
Lymphs Abs: 1040 cells/uL (ref 850–3900)
MCH: 30.6 pg (ref 27.0–33.0)
MCHC: 33.2 g/dL (ref 32.0–36.0)
MCV: 92.4 fL (ref 80.0–100.0)
MONOS PCT: 8 %
MPV: 8.7 fL (ref 7.5–12.5)
Monocytes Absolute: 520 cells/uL (ref 200–950)
Neutro Abs: 4940 cells/uL (ref 1500–7800)
Neutrophils Relative %: 76 %
PLATELETS: 301 10*3/uL (ref 140–400)
RBC: 4.47 MIL/uL (ref 3.80–5.10)
RDW: 14.5 % (ref 11.0–15.0)
WBC: 6.5 10*3/uL (ref 3.8–10.8)

## 2016-07-14 LAB — LIPASE: Lipase: 11 U/L (ref 7–60)

## 2016-07-14 MED ORDER — PROMETHAZINE HCL 12.5 MG PO TABS: ORAL_TABLET | ORAL | 1 refills | Status: DC

## 2016-07-14 MED ORDER — HYDROCODONE-ACETAMINOPHEN 5-325 MG PO TABS: 1.0000 | ORAL_TABLET | Freq: Four times a day (QID) | ORAL | 0 refills | Status: DC | PRN

## 2016-07-14 NOTE — Progress Notes (Signed)
pocPre visit review using our clinic review tool, if applicable. No additional management support is needed unless otherwise documented below in the visit note. 

## 2016-07-14 NOTE — Progress Notes (Signed)
OFFICE VISIT  07/14/2016   CC:  Chief Complaint  Patient presents with  . Back Pain    lower back pain, radiates to flank and mid back  . Nausea   HPI:    Patient is a 46 y.o. Caucasian female who presents for nausea. Slight L LB pain x 1 week or so, mild.  Got a lot worse and extended across to entire low back and even around R side some the last 24h.  Also started feeling nauseated with the increased pain yesterday.  No vomiting.  Drinking some, feeling tired.  Hard to get warm yesterday, taking ibup regularly.  No dysuria, no urgency or frequency, no gross hematuria.  Mid abdomen does ache constantly.  No diarrhea or constipation. No known sick contacts.  No respiratory symptoms. LMP 06/29/15.  This came when she expected it. Eating makes all her symptoms worse.   Ibuprofen helps some.   Past Medical History:  Diagnosis Date  . Anxiety and depression   . Obesity, Class II, BMI 35-39.9     Past Surgical History:  Procedure Laterality Date  . BUNIONECTOMY     right foot  . CESAREAN SECTION  x 2   1995; 2000  . TONSILLECTOMY  1973   as a child  . TUBAL LIGATION      Outpatient Medications Prior to Visit  Medication Sig Dispense Refill  . DULoxetine (CYMBALTA) 60 MG capsule 1 cap po qd 30 capsule 6   No facility-administered medications prior to visit.     Allergies  Allergen Reactions  . Penicillins Hives    ROS As per HPI  PE: Blood pressure 117/79, pulse 84, temperature 99.2 F (37.3 C), temperature source Temporal, resp. rate 16, weight 213 lb (96.6 kg), SpO2 97 %.  Pt examined with Jacklynn Ganong, CMA, as chaperone. Gen: Alert, well appearing.  Patient is oriented to person, place, time, and situation. AFFECT: pleasant, lucid thought and speech. [id number]: no injection, icteris, swelling, or exudate.  EOMI, PERRLA. Mouth: lips without lesion/swelling.  Oral mucosa pink and moist. Oropharynx without erythema, exudate, or swelling.  Neck - No masses or  thyromegaly or limitation in range of motion CV: RRR, no m/r/g.   LUNGS: CTA bilat, nonlabored resps, good aeration in all lung fields. ABD: soft, nondistended, BS normal, no HSM, bruit, or mass.  She has mild TTP in mid/upper abdomen in midline, with just a minimal amount of tenderness in LUQ and RUQ.  No guarding or rebound. EXT: no clubbing, cyanosis, or edema.   LABS:    Chemistry      Component Value Date/Time   NA 139 04/24/2015 0855   K 4.1 04/24/2015 0855   CL 104 04/24/2015 0855   CO2 26 04/24/2015 0855   BUN 12 04/24/2015 0855   CREATININE 0.89 04/24/2015 0855   CREATININE 0.94 04/06/2012 0853      Component Value Date/Time   CALCIUM 9.5 04/24/2015 0855   ALKPHOS 56 04/24/2015 0855   AST 16 04/24/2015 0855   ALT 16 04/24/2015 0855   BILITOT 0.5 04/24/2015 0855     Lab Results  Component Value Date   WBC 6.3 04/24/2015   HGB 13.6 04/24/2015   HCT 41.1 04/24/2015   MCV 93.4 04/24/2015   PLT 390.0 04/24/2015   CC UA today: moderate blood, 100 mg/dl protein, SG 1.025, o/w normal.  IMPRESSION AND PLAN:  LBP, nausea, and upper/mid abd pain:--acute.  Nontoxic-appearing. Microscopic hematuria on UA today. She could be in early  stage of passing a kidney stone. Check CBC, CMET, lipase.  Send urine for c/s but hold off on abx at this time. CT renal stone study today. Phenergan 12.5mg , 1-2 q6h prn, #30, rF x 1. Vicodin 5/325, 1-2 q6h prn pain, #30: rx handed to pt today.  An After Visit Summary was printed and given to the patient.  FOLLOW UP: Return for f/u to be determined based on results of work up.  Signed:  Crissie Sickles, MD           07/14/2016

## 2016-07-15 ENCOUNTER — Encounter: Payer: Self-pay | Admitting: Family Medicine

## 2016-07-15 ENCOUNTER — Other Ambulatory Visit: Payer: Self-pay | Admitting: Family Medicine

## 2016-07-15 LAB — URINE CULTURE

## 2016-07-15 MED ORDER — SULFAMETHOXAZOLE-TRIMETHOPRIM 800-160 MG PO TABS: 1.0000 | ORAL_TABLET | Freq: Two times a day (BID) | ORAL | 0 refills | Status: DC

## 2016-10-22 ENCOUNTER — Ambulatory Visit: Payer: 59 | Admitting: Family Medicine

## 2016-12-20 ENCOUNTER — Emergency Department (HOSPITAL_COMMUNITY)
Admission: EM | Admit: 2016-12-20 | Discharge: 2016-12-20 | Disposition: A | Discharge status: Home / Self Care | Payer: BLUE CROSS/BLUE SHIELD | Attending: Emergency Medicine | Admitting: Emergency Medicine

## 2016-12-20 ENCOUNTER — Encounter (HOSPITAL_COMMUNITY): Payer: Self-pay | Admitting: Emergency Medicine

## 2016-12-20 ENCOUNTER — Emergency Department (HOSPITAL_COMMUNITY): Payer: BLUE CROSS/BLUE SHIELD

## 2016-12-20 DIAGNOSIS — R002 Palpitations: Secondary | ICD-10-CM | POA: Insufficient documentation

## 2016-12-20 DIAGNOSIS — R079 Chest pain, unspecified: Secondary | ICD-10-CM | POA: Diagnosis present

## 2016-12-20 DIAGNOSIS — Z79899 Other long term (current) drug therapy: Secondary | ICD-10-CM | POA: Insufficient documentation

## 2016-12-20 DIAGNOSIS — R0602 Shortness of breath: Secondary | ICD-10-CM | POA: Diagnosis not present

## 2016-12-20 LAB — I-STAT TROPONIN, ED
TROPONIN I, POC: 0 ng/mL (ref 0.00–0.08)
TROPONIN I, POC: 0.01 ng/mL (ref 0.00–0.08)

## 2016-12-20 LAB — URINALYSIS, ROUTINE W REFLEX MICROSCOPIC
BILIRUBIN URINE: NEGATIVE
Glucose, UA: NEGATIVE mg/dL
Ketones, ur: NEGATIVE mg/dL
Leukocytes, UA: NEGATIVE
NITRITE: NEGATIVE
Protein, ur: NEGATIVE mg/dL
SPECIFIC GRAVITY, URINE: 1.008 (ref 1.005–1.030)
pH: 6 (ref 5.0–8.0)

## 2016-12-20 LAB — CBC
HCT: 41.2 % (ref 36.0–46.0)
Hemoglobin: 13.2 g/dL (ref 12.0–15.0)
MCH: 30 pg (ref 26.0–34.0)
MCHC: 32 g/dL (ref 30.0–36.0)
MCV: 93.6 fL (ref 78.0–100.0)
PLATELETS: 325 10*3/uL (ref 150–400)
RBC: 4.4 MIL/uL (ref 3.87–5.11)
RDW: 14 % (ref 11.5–15.5)
WBC: 7.2 10*3/uL (ref 4.0–10.5)

## 2016-12-20 LAB — BASIC METABOLIC PANEL
Anion gap: 10 (ref 5–15)
BUN: 14 mg/dL (ref 6–20)
CO2: 21 mmol/L — AB (ref 22–32)
CREATININE: 1.03 mg/dL — AB (ref 0.44–1.00)
Calcium: 8.8 mg/dL — ABNORMAL LOW (ref 8.9–10.3)
Chloride: 106 mmol/L (ref 101–111)
GFR calc non Af Amer: >60 mL/min (ref >60)
Glucose, Bld: 119 mg/dL — ABNORMAL HIGH (ref 65–99)
Potassium: 3.8 mmol/L (ref 3.5–5.1)
SODIUM: 137 mmol/L (ref 135–145)

## 2016-12-20 LAB — POC URINE PREG, ED: Preg Test, Ur: NEGATIVE

## 2016-12-20 NOTE — ED Notes (Signed)
Patient transported to X-ray 

## 2016-12-20 NOTE — ED Notes (Signed)
Called xray, on the way

## 2016-12-20 NOTE — ED Provider Notes (Signed)
  Physical Exam  BP (!) 145/76 (BP Location: Left Arm)   Pulse 86   Temp 98.9 F (37.2 C) (Oral)   Resp 17   Ht 5\' 3"  (1.6 m)   Wt 96.6 kg (213 lb)   SpO2 97%   BMI 37.73 kg/m   Physical Exam  ED Course  Procedures  MDM 6:37 AM Sign out from [facility], PA-C  Per previous provider MDM: Patient presents with palpitations. EMS EKGs with occasional PVCs but none noted on hospital EKG or hospital rhythm strip. Patient is well-appearing. She is low risk. Heart score 1.  Initial troponin is negative. Chest x-ray with mild changes but no evidence of pulmonary edema or pneumonia.  Highly doubt ACS. PERC negative.  Will plan for delta trop.  [place] with follow up to Cardiology for further evaluation.   8:14 AM- Delta Trop negative. Discussed results with patient. Will DC home with follow up to Cardiology. At time of discharge, Patient is in no acute distress. Vital Signs are stable. Patient is able to ambulate. Patient able to tolerate PO.         Shary Decamp, PA-C 12/20/16 [id number]    Fredia Sorrow, MD 12/20/16 1115

## 2016-12-20 NOTE — ED Notes (Signed)
POC urine preg is Negative

## 2016-12-20 NOTE — ED Notes (Signed)
ED Provider at bedside to discuss plan of care. Repeat trop to be drawn

## 2016-12-20 NOTE — ED Provider Notes (Signed)
[facility] DEPT Provider Note   CSN: [id number] Arrival date & time: 12/20/16  7564     History   Chief Complaint Chief Complaint  Patient presents with  . Chest Pain  . Shortness of Breath    HPI Gabriella Stephens is a 46 y.o. female with a hx of Anxiety and depression presents to the Emergency Department complaining of palpitations that woke her from sleep at 3 AM. Patient reports she had associated left arm heaviness but no overt chest pain.  Patient denies personal or family cardiac history. She states that when her symptoms started she became very anxious. She reports that during this time she had associated shortness of breath but this resolved spontaneously. Her husband drove her to the fire department where she was found to be tachycardic with heart rate of 120 and hypertensive with a blood pressure of 190/110.  EMS reports that her vital signs improved significantly without intervention during her time with them. They noted several PVCs but no significant EKG changes. Patient reports that she drinks approximately 6 cans of diet [facility] each day. She denies additional caffeine intake. She reports also she had one margarita. She denies drug usage including cocaine.  Patient reports that she does not currently have palpitations.  Patient denies leg swelling, dyspnea on exertion, orthopnea. Denies recent travel, estrogen usage, history of DVT, lupus, recent fracture or surgery.   The history is provided by the patient and medical records. No language interpreter was used.    Past Medical History:  Diagnosis Date  . Anxiety and depression   . Hepatic hemangioma    small, right side: seen on u/s 2013, no change on CT 2018.  . Obesity, Class II, BMI 35-39.9     Patient Active Problem List   Diagnosis Date Noted  . Tobacco use 04/29/2012  . Abdominal pain 04/06/2012  . Back pain 04/06/2012  . Hyperlipidemia 04/06/2012    Past Surgical History:  Procedure Laterality  Date  . BUNIONECTOMY     right foot  . CESAREAN SECTION  x 2   1995; 2000  . TONSILLECTOMY  1973   as a child  . TUBAL LIGATION      OB History    No data available       Home Medications    Prior to Admission medications   Medication Sig Start Date End Date Taking? Authorizing Provider  DULoxetine (CYMBALTA) 60 MG capsule 1 cap po qd Patient not taking: Reported on 12/20/2016 04/24/16   Tammi Sou, MD  HYDROcodone-acetaminophen (NORCO/VICODIN) 5-325 MG tablet Take 1-2 tablets by mouth every 6 (six) hours as needed for moderate pain. Patient not taking: Reported on 12/20/2016 07/14/16   Tammi Sou, MD  promethazine (PHENERGAN) 12.5 MG tablet 1-2 tabs po q6h prn nausea Patient not taking: Reported on 12/20/2016 07/14/16   Tammi Sou, MD  sulfamethoxazole-trimethoprim (BACTRIM DS,SEPTRA DS) 800-160 MG tablet Take 1 tablet by mouth 2 (two) times daily. Patient not taking: Reported on 12/20/2016 07/15/16   Tammi Sou, MD    Family History Family History  Problem Relation Age of Onset  . Thyroid disease Mother   . Hypertension Mother   . Diabetes Mother   . Heart disease Mother   . Hyperlipidemia Mother   . Breast cancer Mother   . Hypertension Father   . Hyperlipidemia Father   . Multiple myeloma Father   . Colon cancer Paternal Grandfather   . Kidney disease Neg Hx   .  Asthma Neg Hx   . Stroke Neg Hx   . Esophageal cancer Neg Hx   . Rectal cancer Neg Hx   . Stomach cancer Neg Hx     Social History Social History  Substance Use Topics  . Smoking status: Former Smoker    Packs/day: 1.00    Years: 20.00    Types: Cigarettes    Quit date: 06/10/2011  . Smokeless tobacco: Never Used  . Alcohol use No     Comment: seldom; once a month     Allergies   Penicillins   Review of Systems Review of Systems  Constitutional: Negative for appetite change, diaphoresis, fatigue, fever and unexpected weight change.  HENT: Negative for mouth sores.     Eyes: Negative for visual disturbance.  Respiratory: Positive for shortness of breath. Negative for cough, chest tightness and wheezing.   Cardiovascular: Positive for chest pain and palpitations. Negative for leg swelling.  Gastrointestinal: Negative for abdominal pain, constipation, diarrhea, nausea and vomiting.  Endocrine: Negative for polydipsia, polyphagia and polyuria.  Genitourinary: Negative for dysuria, frequency, hematuria and urgency.  Musculoskeletal: Negative for back pain and neck stiffness.  Skin: Negative for rash.  Allergic/Immunologic: Negative for immunocompromised state.  Neurological: Negative for syncope, light-headedness and headaches.  Hematological: Does not bruise/bleed easily.  Psychiatric/Behavioral: Negative for sleep disturbance. The patient is nervous/anxious.   All other systems reviewed and are negative.    Physical Exam Updated Vital Signs BP (!) 145/76 (BP Location: Left Arm)   Pulse 86   Temp 98.9 F (37.2 C) (Oral)   Resp 17   Ht _0  (1.6 m)   Wt 96.6 kg (213 lb)   SpO2 97%   BMI 37.73 kg/m   Physical Exam  Constitutional: She appears well-developed and well-nourished. No distress.  Awake, alert, nontoxic appearance  HENT:  Head: Normocephalic and atraumatic.  Mouth/Throat: Oropharynx is clear and moist. No oropharyngeal exudate.  Eyes: Conjunctivae are normal. No scleral icterus.  Neck: Normal range of motion. Neck supple.  Cardiovascular: Normal rate, regular rhythm and intact distal pulses.   Pulses:      Radial pulses are 2+ on the right side, and 2+ on the left side.       Dorsalis pedis pulses are 2+ on the right side, and 2+ on the left side.  Pulmonary/Chest: Effort normal and breath sounds normal. No respiratory distress. She has no wheezes.  Equal chest expansion  Abdominal: Soft. Bowel sounds are normal. She exhibits no mass. There is no tenderness. There is no rebound and no guarding.  Musculoskeletal: Normal range of  motion. She exhibits no edema.  Neurological: She is alert.  Speech is clear and goal oriented Moves extremities without ataxia  Skin: Skin is warm and dry. She is not diaphoretic.  Psychiatric: Her mood appears anxious.  Nursing note and vitals reviewed.    ED Treatments / Results  Labs (all labs ordered are listed, but only abnormal results are displayed) Labs Reviewed  BASIC METABOLIC PANEL - Abnormal; Notable for the following:       Result Value   CO2 21 (*)    Glucose, Bld 119 (*)    Creatinine, Ser 1.03 (*)    Calcium 8.8 (*)    All other components within normal limits  URINALYSIS, ROUTINE W REFLEX MICROSCOPIC - Abnormal; Notable for the following:    Color, Urine STRAW (*)    Hgb urine dipstick MODERATE (*)    Bacteria, UA RARE (*)  Squamous Epithelial / LPF 0-5 (*)    All other components within normal limits  CBC  I-STAT TROPOININ, ED  POC URINE PREG, ED    EKG  EKG Interpretation  Date/Time:  Saturday December 20 2016 04:42:01 EDT Ventricular Rate:  70 PR Interval:  200 QRS Duration: 78 QT Interval:  408 QTC Calculation: 440 R Axis:   59 Text Interpretation:  Normal sinus rhythm Normal ECG No old tracing to compare Confirmed by Jola Schmidt [phone]) on 12/20/2016 7:12:09 AM       Radiology Dg Chest 2 View  Result Date: 12/20/2016 CLINICAL DATA:  Shortness of breath and chest pain. EXAM: CHEST  2 VIEW COMPARISON:  None. FINDINGS: Cardiomediastinal silhouette is normal. No pleural effusions or focal consolidations. Strandy densities LEFT lung base. Mild bronchitic changes. Trachea projects midline and there is no pneumothorax. Soft tissue planes and included osseous structures are non-suspicious. IMPRESSION: Mild bronchitic changes.  LEFT lung base atelectasis. Electronically Signed   By: Elon Alas M.D.   On: 12/20/2016 06:16    Procedures Procedures (including critical care time)  Medications Ordered in ED Medications - No data to  display   Initial Impression / Assessment and Plan / ED Course  I have reviewed the triage vital signs and the nursing notes.  Pertinent labs & imaging results that were available during my care of the patient were reviewed by me and considered in my medical decision making (see chart for details).     Patient presents with palpitations. EMS EKGs with occasional PVCs but none noted on hospital EKG or hospital rhythm strip. Patient is well-appearing. She is low risk. Heart score 1.  Initial troponin is negative. Chest x-ray with mild changes but no evidence of pulmonary edema or pneumonia.  Highly doubt ACS. PERC negative.  Will plan for delta trop.  At shift change care was transferred to Shary Decamp, PA-C who will follow pending studies, re-evaulate and determine disposition.     Final Clinical Impressions(s) / ED Diagnoses   Final diagnoses:  Palpitations    New Prescriptions New Prescriptions   No medications on file     Agapito Games 12/20/16 6045    Jola Schmidt, MD 12/20/16 [phone]

## 2016-12-20 NOTE — Discharge Instructions (Signed)
Please read and follow all provided instructions.  Your diagnoses today include:  1. Palpitations     Tests performed today include: An EKG of your heart A chest x-ray Cardiac enzymes - a blood test for heart muscle damage Blood counts and electrolytes Vital signs. See below for your results today.   Medications prescribed:   Take any prescribed medications only as directed.  Follow-up instructions: Please follow-up with your primary care provider as soon as you can for further evaluation of your symptoms.   Return instructions:  SEEK IMMEDIATE MEDICAL ATTENTION IF: You have severe chest pain, especially if the pain is crushing or pressure-like and spreads to the arms, back, neck, or jaw, or if you have sweating, nausea (feeling sick to your stomach), or shortness of breath. THIS IS AN EMERGENCY. Don't wait to see if the pain will go away. Get medical help at once. Call 911 or 0 (operator). DO NOT drive yourself to the hospital.  Your chest pain gets worse and does not go away with rest.  You have an attack of chest pain lasting longer than usual, despite rest and treatment with the medications your caregiver has prescribed.  You wake from sleep with chest pain or shortness of breath. You feel dizzy or faint. You have chest pain not typical of your usual pain for which you originally saw your caregiver.  You have any other emergent concerns regarding your health.  Additional Information: Chest pain comes from many different causes. Your caregiver has diagnosed you as having chest pain that is not specific for one problem, but does not require admission.  You are at low risk for an acute heart condition or other serious illness.   Your vital signs today were: BP 122/73    Pulse 63    Temp 98.9 F (37.2 C) (Oral)    Resp 13    Ht 5\' 3"  (1.6 m)    Wt 96.6 kg (213 lb)    SpO2 97%    BMI 37.73 kg/m  If your blood pressure (BP) was elevated above 135/85 this visit, please have this  repeated by your doctor within one month. --------------

## 2016-12-20 NOTE — ED Triage Notes (Signed)
Pt woke up around 3am w/ SOB and chest "heaviness".   Initially bp was 190/110, heartrate 120, w/ PVC pt was very anxious at that time.  Upon arrival pt was bp was 140/90, HR of 76.  Was given 324ASA.

## 2017-01-28 ENCOUNTER — Encounter: Payer: Self-pay | Admitting: Family Medicine

## 2017-01-28 LAB — HM MAMMOGRAPHY

## 2017-02-10 ENCOUNTER — Encounter: Payer: Self-pay | Admitting: Family Medicine

## 2017-04-09 ENCOUNTER — Ambulatory Visit (INDEPENDENT_AMBULATORY_CARE_PROVIDER_SITE_OTHER): Payer: BLUE CROSS/BLUE SHIELD

## 2017-04-09 DIAGNOSIS — Z23 Encounter for immunization: Secondary | ICD-10-CM | POA: Diagnosis not present

## 2017-04-29 ENCOUNTER — Encounter: Payer: Self-pay | Admitting: Family Medicine

## 2017-04-29 ENCOUNTER — Ambulatory Visit (INDEPENDENT_AMBULATORY_CARE_PROVIDER_SITE_OTHER): Payer: BLUE CROSS/BLUE SHIELD | Admitting: Family Medicine

## 2017-04-29 VITALS — BP 126/82 | HR 66 | Temp 98.1°F | Resp 16 | Ht 63.0 in | Wt 189.0 lb

## 2017-04-29 DIAGNOSIS — L989 Disorder of the skin and subcutaneous tissue, unspecified: Secondary | ICD-10-CM

## 2017-04-29 DIAGNOSIS — Z0000 Encounter for general adult medical examination without abnormal findings: Secondary | ICD-10-CM | POA: Diagnosis not present

## 2017-04-29 LAB — CBC WITH DIFFERENTIAL/PLATELET
BASOS PCT: 1 % (ref 0.0–3.0)
Basophils Absolute: 0.1 10*3/uL (ref 0.0–0.1)
EOS ABS: 0.1 10*3/uL (ref 0.0–0.7)
Eosinophils Relative: 1 % (ref 0.0–5.0)
HEMATOCRIT: 42.8 % (ref 36.0–46.0)
Hemoglobin: 14 g/dL (ref 12.0–15.0)
LYMPHS PCT: 30.9 % (ref 12.0–46.0)
Lymphs Abs: 1.9 10*3/uL (ref 0.7–4.0)
MCHC: 32.7 g/dL (ref 30.0–36.0)
MCV: 95.1 fl (ref 78.0–100.0)
Monocytes Absolute: 0.6 10*3/uL (ref 0.1–1.0)
Monocytes Relative: 9 % (ref 3.0–12.0)
NEUTROS ABS: 3.6 10*3/uL (ref 1.4–7.7)
Neutrophils Relative %: 58.1 % (ref 43.0–77.0)
PLATELETS: 338 10*3/uL (ref 150.0–400.0)
RBC: 4.5 Mil/uL (ref 3.87–5.11)
RDW: 14.5 % (ref 11.5–15.5)
WBC: 6.2 10*3/uL (ref 4.0–10.5)

## 2017-04-29 LAB — COMPREHENSIVE METABOLIC PANEL
ALBUMIN: 4.3 g/dL (ref 3.5–5.2)
ALT: 12 U/L (ref 0–35)
AST: 16 U/L (ref 0–37)
Alkaline Phosphatase: 51 U/L (ref 39–117)
BILIRUBIN TOTAL: 0.7 mg/dL (ref 0.2–1.2)
BUN: 12 mg/dL (ref 6–23)
CALCIUM: 9.6 mg/dL (ref 8.4–10.5)
CHLORIDE: 104 meq/L (ref 96–112)
CO2: 26 meq/L (ref 19–32)
CREATININE: 0.78 mg/dL (ref 0.40–1.20)
GFR: 84.11 mL/min (ref >60.00)
GLUCOSE: 94 mg/dL (ref 70–99)
POTASSIUM: 4.6 meq/L (ref 3.5–5.1)
Sodium: 137 mEq/L (ref 135–145)
Total Protein: 7.1 g/dL (ref 6.0–8.3)

## 2017-04-29 LAB — TSH: TSH: 2.82 u[IU]/mL (ref 0.35–4.50)

## 2017-04-29 MED ORDER — FLUTICASONE PROPIONATE 0.05 % EX CREA: TOPICAL_CREAM | Freq: Two times a day (BID) | CUTANEOUS | 0 refills | Status: DC

## 2017-04-29 NOTE — Patient Instructions (Signed)

## 2017-04-29 NOTE — Progress Notes (Signed)
Office Note 04/29/2017  CC:  Chief Complaint  Patient presents with  . Annual Exam    HPI:  Gabriella Stephens is a 46 y.o. female who is here for annual health maintenance exam. She has some labs done via her employer from 04/01/17:  Cholesterol panel normal, fasting glucose 96, BP 123/75, BMI 34.2.  She is not taking ANY medications at this time.  Review of all bp's here in the past shows avg syst 120s, avg diast 70s, HR 70s.  Eyes: exam this year. Dental: preventatives--"I need to go" Exercise: none currently. Diet: low carbs  Has noted a pinkish patch of skin on upper chest for last few weeks, some itchiness in the area. She has not applied anything to this.   Past Medical History:  Diagnosis Date  . Anxiety and depression   . Hepatic hemangioma    small, right side: seen on u/s 2013, no change on CT 2018.  . Obesity, Class II, BMI 35-39.9     Past Surgical History:  Procedure Laterality Date  . BUNIONECTOMY     right foot  . CESAREAN SECTION  x 2   1995; 2000  . TONSILLECTOMY  1973   as a child  . TUBAL LIGATION  2001    Family History  Problem Relation Age of Onset  . Thyroid disease Mother   . Hypertension Mother   . Diabetes Mother   . Heart disease Mother   . Hyperlipidemia Mother   . Breast cancer Mother   . Hypertension Father   . Hyperlipidemia Father   . Multiple myeloma Father   . Colon cancer Paternal Grandfather   . Kidney disease Neg Hx   . Asthma Neg Hx   . Stroke Neg Hx   . Esophageal cancer Neg Hx   . Rectal cancer Neg Hx   . Stomach cancer Neg Hx     Social History   Socioeconomic History  . Marital status: Married    Spouse name: Not on file  . Number of children: 3  . Years of education: Not on file  . Highest education level: Not on file  Social Needs  . Financial resource strain: Not on file  . Food insecurity - worry: Not on file  . Food insecurity - inability: Not on file  . Transportation needs - medical: Not  on file  . Transportation needs - non-medical: Not on file  Occupational History  . Occupation: Best boy: [employer]    Employer: [employer]  Tobacco Use  . Smoking status: Former Smoker    Packs/day: 1.00    Years: 20.00    Pack years: 20.00    Types: Cigarettes    Last attempt to quit: 06/10/2011    Years since quitting: 5.8  . Smokeless tobacco: Never Used  Substance and Sexual Activity  . Alcohol use: No    Comment: seldom; once a month  . Drug use: No  . Sexual activity: Not on file  Other Topics Concern  . Not on file  Social History Narrative   Married, 3 children.  Lives in [place].   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   Tob: 20 pack-yr hx, quit 2013.   Alc: rare.   No drugs.       Outpatient Medications Prior to Visit  Medication Sig Dispense Refill  . DULoxetine (CYMBALTA) 60 MG capsule 1 cap po qd (Patient not taking: Reported on 12/20/2016) 30  capsule 6  . HYDROcodone-acetaminophen (NORCO/VICODIN) 5-325 MG tablet Take 1-2 tablets by mouth every 6 (six) hours as needed for moderate pain. (Patient not taking: Reported on 12/20/2016) 30 tablet 0  . promethazine (PHENERGAN) 12.5 MG tablet 1-2 tabs po q6h prn nausea (Patient not taking: Reported on 12/20/2016) 30 tablet 1  . sulfamethoxazole-trimethoprim (BACTRIM DS,SEPTRA DS) 800-160 MG tablet Take 1 tablet by mouth 2 (two) times daily. (Patient not taking: Reported on 12/20/2016) 10 tablet 0   No facility-administered medications prior to visit.     Allergies  Allergen Reactions  . Penicillins Hives    ROS Review of Systems  Constitutional: Negative for appetite change, chills, fatigue and fever.  HENT: Negative for congestion, dental problem, ear pain and sore throat.   Eyes: Negative for discharge, redness and visual disturbance.  Respiratory: Negative for cough, chest tightness, shortness of breath and wheezing.   Cardiovascular: Negative for chest pain, palpitations  and leg swelling.  Gastrointestinal: Negative for abdominal pain, blood in stool, diarrhea, nausea and vomiting.  Genitourinary: Negative for difficulty urinating, dysuria, flank pain, frequency, hematuria and urgency.  Musculoskeletal: Negative for arthralgias, back pain, joint swelling, myalgias and neck stiffness.  Skin: Negative for pallor and rash.  Neurological: Negative for dizziness, speech difficulty, weakness and headaches.  Hematological: Negative for adenopathy. Does not bruise/bleed easily.  Psychiatric/Behavioral: Negative for confusion and sleep disturbance. The patient is not nervous/anxious.     PE; Blood pressure 126/82, pulse 66, temperature 98.1 F (36.7 C), temperature source Oral, resp. rate 16, height 5' 3"  (1.6 m), weight 189 lb (85.7 kg), SpO2 99 %.  Pt examined with Etheleen Sia, nurse, as chaperone.  Gen: Alert, well appearing.  Patient is oriented to person, place, time, and situation. AFFECT: pleasant, lucid thought and speech. ENT: Ears: EACs clear, normal epithelium.  TMs with good light reflex and landmarks bilaterally.  Eyes: no injection, icteris, swelling, or exudate.  EOMI, PERRLA. Nose: no drainage or turbinate edema/swelling.  No injection or focal lesion.  Mouth: lips without lesion/swelling.  Oral mucosa pink and moist.  Dentition intact and without obvious caries or gingival swelling.  Oropharynx without erythema, exudate, or swelling.  Neck: supple/nontender.  No LAD, mass, or TM.  Carotid pulses 2+ bilaterally, without bruits. CV: RRR, no m/r/g.   LUNGS: CTA bilat, nonlabored resps, good aeration in all lung fields. ABD: soft, NT, ND, BS normal.  No hepatospenomegaly or mass.  No bruits. EXT: no clubbing, cyanosis, or edema.  Musculoskeletal: no joint swelling, erythema, warmth, or tenderness.  ROM of all joints intact. Skin - no sores, rashes or color changes.  On the upper chest wall in the L of center, he has a 1 cm irreg -shaped pink plaque  that has well demarcated borders, +blanches with pressure applied.  Nontender.  No superficial peeling/flaking.   Pertinent labs:  Lab Results  Component Value Date   TSH 3.21 04/24/2015   Lab Results  Component Value Date   WBC 7.2 12/20/2016   HGB 13.2 12/20/2016   HCT 41.2 12/20/2016   MCV 93.6 12/20/2016   PLT 325 12/20/2016   Lab Results  Component Value Date   CREATININE 1.03 (H) 12/20/2016   BUN 14 12/20/2016   NA 137 12/20/2016   K 3.8 12/20/2016   CL 106 12/20/2016   CO2 21 (L) 12/20/2016   Lab Results  Component Value Date   ALT 14 07/14/2016   AST 19 07/14/2016   ALKPHOS 52 07/14/2016   BILITOT 0.6  07/14/2016   Lab Results  Component Value Date   CHOL 200 04/24/2015   Lab Results  Component Value Date   HDL 53.00 04/24/2015   Lab Results  Component Value Date   LDLCALC 123 (H) 04/24/2015   Lab Results  Component Value Date   TRIG 121.0 04/24/2015   Lab Results  Component Value Date   CHOLHDL 4 04/24/2015   ASSESSMENT AND PLAN:   1) Skin lesion upper chest wall: question of small area of dermatitis. Will have her apply cutivate 0.05% cream to the area bid.  If not improved any in 2 weeks, return for recheck---will likely do shave excision and send for path at that time.  2) Health maintenance exam: Reviewed age and gender appropriate health maintenance issues (prudent diet, regular exercise, health risks of tobacco and excessive alcohol, use of seatbelts, fire alarms in home, use of sunscreen).  Also reviewed age and gender appropriate health screening as well as vaccine recommendations. Vaccines: UTD, including flu. Labs: fasting HP. Breast ca screening: last mammo done via [facility] 01/27/17--NORMAL. Cerv ca screening: got last pap 01/2017, was told it was normal.  An After Visit Summary was printed and given to the patient.  FOLLOW UP:  Return in about 1 year (around 04/29/2018) for annual CPE (fasting).  Signed:  Crissie Sickles,  MD           04/29/2017

## 2017-10-01 ENCOUNTER — Ambulatory Visit: Payer: Self-pay

## 2017-10-01 NOTE — Telephone Encounter (Signed)
Patient called in with c/o "heart flutter." She says "I don't really know what is going on, but I have been waking up every night for the past 2 weeks feeling like I need to take a deep breath but can't. I might feel anxious, I don't really know. I go get a drink of water, then sit for a minute, then I'm able to lay back down. The flutters I have felt off and on for a few days and I don't know if this is what is happening. I was seen in the ER last year for the same thing and they said I was having palpitations. I don't feel like I'm stressed, but it may be anxiety." I asked her to count her pulse and I time it for 15 seconds, HR was 100. I asked about other symptoms, she denies. According to protocol, see PCP within 3 days, appointment scheduled for Monday, 10/05/17 at 1045, care advice given, patient verbalized understanding.   Reason for Disposition . [1] Palpitations AND [2] no improvement after following Care Advice  Answer Assessment - Initial Assessment Questions 1. DESCRIPTION: "Please describe your heart rate or heart beat that you are having" (e.g., fast/slow, regular/irregular, skipped or extra beats, "palpitations")    Like a flutter 2. ONSET: "When did it start?" (Minutes, hours or days)      Noticed the flutter this week, a couple of times 3. DURATION: "How long does it last" (e.g., seconds, minutes, hours)     I don't know 4. PATTERN "Does it come and go, or has it been constant since it started?"  "Does it get worse with exertion?"   "Are you feeling it now?"     Comes and goes 5. TAP: "Using your hand, can you tap out what you are feeling on a chair or table in front of you, so that I can hear?" (Note: not all patients can do this)       Regular 6. HEART RATE: "Can you tell me your heart rate?" "How many beats in 15 seconds?"  (Note: not all patients can do this)       100 7. RECURRENT SYMPTOM: "Have you ever had this before?" If so, ask: "When was the last time?" and "What  happened that time?"      Yes, about a year ago 8. CAUSE: "What do you think is causing the palpitations?"     I don't know 9. CARDIAC HISTORY: "Do you have any history of heart disease?" (e.g., heart attack, angina, bypass surgery, angioplasty, arrhythmia)     No; mother has  29. OTHER SYMPTOMS: "Do you have any other symptoms?" (e.g., dizziness, chest pain, sweating, difficulty breathing)      Wake up and can't get a good breath feeling-past 2 weeks 11. PREGNANCY: "Is there any chance you are pregnant?" "When was your last menstrual period?"       No-LMP 09/07/17  Protocols used: HEART RATE AND HEARTBEAT QUESTIONS-A-AH

## 2017-10-02 ENCOUNTER — Encounter: Payer: Self-pay | Admitting: *Deleted

## 2017-10-05 ENCOUNTER — Ambulatory Visit (INDEPENDENT_AMBULATORY_CARE_PROVIDER_SITE_OTHER): Payer: BLUE CROSS/BLUE SHIELD | Admitting: Family Medicine

## 2017-10-05 ENCOUNTER — Encounter: Payer: Self-pay | Admitting: Family Medicine

## 2017-10-05 VITALS — BP 126/85 | HR 69 | Temp 98.2°F | Resp 16 | Ht 63.0 in | Wt 199.4 lb

## 2017-10-05 DIAGNOSIS — H6981 Other specified disorders of Eustachian tube, right ear: Secondary | ICD-10-CM

## 2017-10-05 DIAGNOSIS — R002 Palpitations: Secondary | ICD-10-CM | POA: Diagnosis not present

## 2017-10-05 DIAGNOSIS — H6991 Unspecified Eustachian tube disorder, right ear: Secondary | ICD-10-CM

## 2017-10-05 DIAGNOSIS — E66812 Obesity, class 2: Secondary | ICD-10-CM

## 2017-10-05 DIAGNOSIS — R0602 Shortness of breath: Secondary | ICD-10-CM

## 2017-10-05 DIAGNOSIS — G4733 Obstructive sleep apnea (adult) (pediatric): Secondary | ICD-10-CM

## 2017-10-05 DIAGNOSIS — E669 Obesity, unspecified: Secondary | ICD-10-CM | POA: Diagnosis not present

## 2017-10-05 LAB — BASIC METABOLIC PANEL
BUN: 14 mg/dL (ref 6–23)
CALCIUM: 9.4 mg/dL (ref 8.4–10.5)
CHLORIDE: 101 meq/L (ref 96–112)
CO2: 28 meq/L (ref 19–32)
Creatinine, Ser: 0.74 mg/dL (ref 0.40–1.20)
GFR: 89.21 mL/min (ref >60.00)
GLUCOSE: 80 mg/dL (ref 70–99)
Potassium: 4.3 mEq/L (ref 3.5–5.1)
SODIUM: 139 meq/L (ref 135–145)

## 2017-10-05 LAB — TSH: TSH: 2.39 u[IU]/mL (ref 0.35–4.50)

## 2017-10-05 LAB — MAGNESIUM: Magnesium: 2 mg/dL (ref 1.5–2.5)

## 2017-10-05 MED ORDER — FLUTICASONE PROPIONATE 50 MCG/ACT NA SUSP: 2.0000 | Freq: Every day | NASAL | 6 refills | Status: DC

## 2017-10-05 NOTE — Progress Notes (Signed)
OFFICE VISIT  10/05/2017   CC:  Chief Complaint  Patient presents with  . Shortness of Breath    would wake up during the night  . Palpitations    Heart Flutter  . Ear Pain    right ear, clogged     HPI:    Patient is a 47 y.o.  female who presents for palpitations and the above complaints listed in HPI. Onset 1 wk ago R ear full/cant hear.  No drainage.  She did have some allergic rhinitis a few days prior to onset. Rare shooting pain in R ear.  Dry, hacky cough.    About 1 yr ago she had an episode of palpitations, felt SOB when it occurred. EMS was called and bp was high, so she was taken to ED where tests were done and all neg. Then, about 1 mo ago these episodes returned, gradually worsening, esp the last two weeks they have been nightly. Last 2 wks these have exclusively been occurring at night while asleep.  She wakes up feeling like she can't get a deep breath.  During some episodes of this she feels a few seconds of her heart fluttering.  No dizziness.  No diaphoresis. No chest pain.  Getting up on side of bed and/or walking around for a minute makes it resolve.   Occ daily symptoms that are similar but she is not lying supine--she is sitting.  Not brought on EVER by exertion. She wonders if it is a lot of stress causing her sx's--mostly centered around work. No otc meds, herbals, or new foods.  ROS: some reddish rash on upper chest that comes and goes, not itchy or painful. No abd pain, GI sx's, urinary sx's, HAs.  Occ slight feel swelling in feet--attributes this to excessive salt intake lately. Denies GERD/heart burn. No FH of dx'd cardiac dysrhythmia.  She does snore.  Husband has not mentioned whether or not she has hypopnea, apneic spells, or gasping in sleep.   Past Medical History:  Diagnosis Date  . Anxiety and depression   . Hepatic hemangioma    small, right side: seen on u/s 2013, no change on CT 2018.  . Obesity, Class II, BMI 35-39.9     Past  Surgical History:  Procedure Laterality Date  . BUNIONECTOMY     right foot  . CESAREAN SECTION  x 2   1995; 2000  . TONSILLECTOMY  1973   as a child  . TUBAL LIGATION  2001    Outpatient Medications Prior to Visit  Medication Sig Dispense Refill  . fluticasone (CUTIVATE) 0.05 % cream Apply topically 2 (two) times daily. (Patient not taking: Reported on 10/05/2017) 30 g 0   No facility-administered medications prior to visit.     Allergies  Allergen Reactions  . Penicillins Hives    ROS As per HPI  PE: Blood pressure 126/85, pulse 69, temperature 98.2 F (36.8 C), temperature source Oral, resp. rate 16, height 5\' 3"  (1.6 m), weight 199 lb 6 oz (90.4 kg), last menstrual period 09/07/2017, SpO2 100 %. Gen: Alert, well appearing.  Patient is oriented to person, place, time, and situation. AFFECT: pleasant, lucid thought and speech. CV: RRR, no m/r/g.   LUNGS: CTA bilat, nonlabored resps, good aeration in all lung fields. EXT: no clubbing, cyanosis, or edema.    LABS:    Chemistry      Component Value Date/Time   NA 137 04/29/2017 1027   K 4.6 04/29/2017 1027   CL  104 04/29/2017 1027   CO2 26 04/29/2017 1027   BUN 12 04/29/2017 1027   CREATININE 0.78 04/29/2017 1027   CREATININE 0.92 07/14/2016 1618      Component Value Date/Time   CALCIUM 9.6 04/29/2017 1027   ALKPHOS 51 04/29/2017 1027   AST 16 04/29/2017 1027   ALT 12 04/29/2017 1027   BILITOT 0.7 04/29/2017 1027     Lab Results  Component Value Date   TSH 2.82 04/29/2017   Lab Results  Component Value Date   WBC 6.2 04/29/2017   HGB 14.0 04/29/2017   HCT 42.8 04/29/2017   MCV 95.1 04/29/2017   PLT 338.0 04/29/2017   Lab Results  Component Value Date   CHOL 200 04/24/2015   HDL 53.00 04/24/2015   LDLCALC 123 (H) 04/24/2015   TRIG 121.0 04/24/2015   CHOLHDL 4 04/24/2015   12 lead EKG today: NSR, rate 71, no ectopy or hypertrophy.  No ST changes or Q waves.  Nonspecific RSR' in V1 with TWI in  this lead.  Low voltage aVL.  Compared to 12/20/16 EKG no change. Essentially normal EKG.  IMPRESSION AND PLAN:  1) Episodic SOB, primarily nocturnal/awaken her from sleep.  Occ feels heart flutters briefly with these and she admits these feelings make her anxious/panicked a little:  ?PND, ?tachyarhythmia, ?panic. No orthopnea and no sx's of HF in daytime. EKG today: normal. Plan: Holter 48H.  Hold off on echo at this time. BMET, TSH, Magnesium today. OSA is a distinct possibility.  I've asked pt to have her husband observe her breathing patterns during sleep. If holter is not going to be available for 2 or more weeks, will refer to neuro or pulm for OSA eval. Anxiety/stress possible cause BUT this is a diagnosis of exclusion.  2) Allergic rhinitis, with R eustachian tube dysfunction. Flonase rx'd, allegra 180mg  qd recommended.  An After Visit Summary was printed and given to the patient.  FOLLOW UP: Return for to be determined based on results of w/u.  Signed:  Crissie Sickles, MD           10/05/2017

## 2017-10-06 ENCOUNTER — Telehealth: Payer: Self-pay | Admitting: Family Medicine

## 2017-10-06 NOTE — Telephone Encounter (Signed)
Copied from [place] [id number]. Topic: Quick Communication - Lab Results >> Oct 06, 2017 11:20 AM Jeanell Sparrow Lynnell Jude, CMA wrote: Faythe Ghee for [facility] nurse to notify patient of results. See note.

## 2017-10-06 NOTE — Telephone Encounter (Signed)
See result note.  

## 2017-10-07 DIAGNOSIS — R002 Palpitations: Secondary | ICD-10-CM

## 2017-10-07 DIAGNOSIS — H6981 Other specified disorders of Eustachian tube, right ear: Secondary | ICD-10-CM

## 2017-10-07 DIAGNOSIS — H6991 Unspecified Eustachian tube disorder, right ear: Secondary | ICD-10-CM

## 2017-10-07 HISTORY — DX: Other specified disorders of eustachian tube, right ear: H69.81

## 2017-10-07 HISTORY — PX: OTHER SURGICAL HISTORY: SHX169

## 2017-10-07 HISTORY — DX: Palpitations: R00.2

## 2017-10-07 HISTORY — DX: Unspecified eustachian tube disorder, right ear: H69.91

## 2017-10-09 ENCOUNTER — Ambulatory Visit (INDEPENDENT_AMBULATORY_CARE_PROVIDER_SITE_OTHER): Payer: BLUE CROSS/BLUE SHIELD | Admitting: Family Medicine

## 2017-10-09 ENCOUNTER — Encounter: Payer: Self-pay | Admitting: Family Medicine

## 2017-10-09 ENCOUNTER — Ambulatory Visit: Payer: Self-pay

## 2017-10-09 VITALS — BP 111/72 | HR 68 | Temp 98.2°F | Resp 16 | Ht 63.0 in | Wt 201.5 lb

## 2017-10-09 DIAGNOSIS — J301 Allergic rhinitis due to pollen: Secondary | ICD-10-CM

## 2017-10-09 DIAGNOSIS — H6981 Other specified disorders of Eustachian tube, right ear: Secondary | ICD-10-CM

## 2017-10-09 MED ORDER — PREDNISONE 20 MG PO TABS: ORAL_TABLET | ORAL | 0 refills | Status: DC

## 2017-10-09 NOTE — Telephone Encounter (Signed)
Pt. Reports she was seen earlier this week with allergy symptoms, but still is having right ear pain/fullness. Has been taking Allegra and using nasal spray. Appointment made for today.  Reason for Disposition . Earache persists > 1 hour  Answer Assessment - Initial Assessment Questions 1. LOCATION: "Which ear is involved?"       Right 2. SENSATION: "Describe how the ear feels."      Feels full and sometimes hurt 3. ONSET:  "When did the ear symptoms start?"       2 weeks ago 4. PAIN: "Do you also have an earache?" If so, ask: "How bad is it?" (Scale 1-10; or mild, moderate, severe)     10  5. CAUSE: "What do you think is causing the ear congestion?"     Unsure 6. URI: "Do you have a runny nose or cough?"      No 7. NASAL ALLERGIES: "Are there symptoms of hay fever, such as sneezing or a clear nasal discharge?"     I did earlier 8. PREGNANCY: "Is there any chance you are pregnant?" "When was your last menstrual period?"     No  Protocols used: EAR - CONGESTION-A-AH

## 2017-10-09 NOTE — Progress Notes (Signed)
OFFICE VISIT  10/09/2017   CC:  Chief Complaint  Patient presents with  . Ear Pain    right     HPI:    Patient is a 47 y.o. Caucasian female who presents for intermittent right ear pain and fullness/roar/decreased hearing. I saw her 4 d/a and felt like she had eustachian tube dysfunction associated with her allergic rhinitis. I rx'd flonase and recommended allegra 180 mg qd. Feels like nose is clear, says face is not painful.  No fevers or ear drainage.  Past Medical History:  Diagnosis Date  . Anxiety and depression   . Hepatic hemangioma    small, right side: seen on u/s 2013, no change on CT 2018.  . Obesity, Class II, BMI 35-39.9     Past Surgical History:  Procedure Laterality Date  . BUNIONECTOMY     right foot  . CESAREAN SECTION  x 2   1995; 2000  . TONSILLECTOMY  1973   as a child  . TUBAL LIGATION  2001    Outpatient Medications Prior to Visit  Medication Sig Dispense Refill  . fexofenadine (ALLEGRA) 180 MG tablet Take 180 mg by mouth daily.    . fluticasone (FLONASE) 50 MCG/ACT nasal spray Place 2 sprays into both nostrils daily. 16 g 6   No facility-administered medications prior to visit.     Allergies  Allergen Reactions  . Penicillins Hives    ROS As per HPI  PE: Blood pressure 111/72, pulse 68, temperature 98.2 F (36.8 C), temperature source Oral, resp. rate 16, height 5\' 3"  (1.6 m), weight 201 lb 8 oz (91.4 kg), last menstrual period 10/07/2017, SpO2 99 %. Gen: Alert, well appearing.  Patient is oriented to person, place, time, and situation. AFFECT: pleasant, lucid thought and speech. TML:YYTK: no injection, icteris, swelling, or exudate.  EOMI, PERRLA. Mouth: lips without lesion/swelling.  Oral mucosa pink and moist. Oropharynx without erythema, exudate, or swelling.  EARS: EAC's normal bilat, no signif cerumen on either side.  Both TMs appear retracted but o/w normal appearance. When I have her blow hard against closed nostrils I see  no movement of her TM.  LABS:  none  IMPRESSION AND PLAN:  1) R eustachian tube dysfunction associated with recent mild flare of her allergic rhinitis. All sx's improved EXCEPT her ear is driving her crazy. Prednisone 40mg  qd x 5d. Continue flonase and allegra.  An After Visit Summary was printed and given to the patient.  FOLLOW UP: Return if symptoms worsen or fail to improve.  Signed:  Crissie Sickles, MD           10/09/2017

## 2017-10-09 NOTE — Telephone Encounter (Signed)
Noted  

## 2017-10-15 ENCOUNTER — Ambulatory Visit (INDEPENDENT_AMBULATORY_CARE_PROVIDER_SITE_OTHER): Payer: BLUE CROSS/BLUE SHIELD

## 2017-10-15 DIAGNOSIS — R002 Palpitations: Secondary | ICD-10-CM

## 2017-10-16 ENCOUNTER — Other Ambulatory Visit: Payer: Self-pay | Admitting: Family Medicine

## 2017-10-16 ENCOUNTER — Ambulatory Visit: Payer: Self-pay | Admitting: *Deleted

## 2017-10-16 DIAGNOSIS — H6981 Other specified disorders of Eustachian tube, right ear: Secondary | ICD-10-CM

## 2017-10-16 DIAGNOSIS — H938X1 Other specified disorders of right ear: Secondary | ICD-10-CM

## 2017-10-16 NOTE — Telephone Encounter (Signed)
Called in c/o continued right ear being "stopped up intermittently and feeling full" after completing her prednisone on Tuesday.   She is still on Allegra D and a steroid nasal spray.    She did not want another appt since she has been seen twice for this.   She just wanted to see what Dr. Anitra Lauth wants to do now.  See triage notes below.  I have routed a note to Dr. Anitra Lauth letting him know of her continued symptoms.  If he recommends an ENT referral she would like to see Dr. Janace Hoard at [facility].   If he is not covered by her insurance than whoever he recommends IF he determines she needs an ENT referral.    Reason for Disposition . Ear congestion present > 48 hours  Answer Assessment - Initial Assessment Questions 1. LOCATION: "Which ear is involved?"       Right ear is stopped up.  It comes and goes.  Then later it totally clogs up.   When I swallow it crackles.  I finished prednisone on Tuesday.   Still using the nasal spray and Allegra D. 2. SENSATION: "Describe how the ear feels."      Clogs up intermittently.    No pain. 3. ONSET:  "When did the ear symptoms start?"       On Good Friday I started with a sore throat but that cleared up. 4. PAIN: "Do you also have an earache?" If so, ask: "How bad is it?" (Scale 1-10; or mild, moderate, severe)     No pain.   May a little pain intermittently but nothing that bothers me. 5. CAUSE: "What do you think is causing the ear congestion?"     I constantly hear my myself swallowing.   When it clogs up I can hear my self talking. 6. URI: "Do you have a runny nose or cough?"      See above.   Not now 7. NASAL ALLERGIES: "Are there symptoms of hay fever, such as sneezing or a clear nasal discharge?"     No.  Never in the past. 8. PREGNANCY: "Is there any chance you are pregnant?" "When was your last menstrual period?"     Not asked  Protocols used: EAR - CONGESTION-A-AH

## 2017-10-16 NOTE — Telephone Encounter (Signed)
Please advise. Thanks.  

## 2017-10-16 NOTE — Telephone Encounter (Signed)
Next step is to see ENT. I'll order now. -thx

## 2017-11-10 ENCOUNTER — Encounter: Payer: Self-pay | Admitting: Family Medicine

## 2017-11-10 DIAGNOSIS — H6983 Other specified disorders of Eustachian tube, bilateral: Secondary | ICD-10-CM | POA: Diagnosis not present

## 2017-11-10 DIAGNOSIS — H6981 Other specified disorders of Eustachian tube, right ear: Secondary | ICD-10-CM | POA: Diagnosis not present

## 2017-11-12 ENCOUNTER — Encounter: Payer: Self-pay | Admitting: Family Medicine

## 2017-11-16 ENCOUNTER — Encounter: Payer: Self-pay | Admitting: *Deleted

## 2017-11-16 NOTE — Telephone Encounter (Signed)
Please advise. Thanks.  

## 2017-11-16 NOTE — Telephone Encounter (Signed)
OK. She could either make appt to come in and discuss tx for anxiety--such as getting back on cymbalta--OR--I can just start her on cymbalta at beginning dose now and see her for office f/u in 3-4 weeks to follow up this problem/med.

## 2017-11-18 DIAGNOSIS — H6981 Other specified disorders of Eustachian tube, right ear: Secondary | ICD-10-CM | POA: Diagnosis not present

## 2017-12-17 DIAGNOSIS — H903 Sensorineural hearing loss, bilateral: Secondary | ICD-10-CM | POA: Diagnosis not present

## 2017-12-17 DIAGNOSIS — H938X1 Other specified disorders of right ear: Secondary | ICD-10-CM | POA: Insufficient documentation

## 2017-12-29 ENCOUNTER — Other Ambulatory Visit: Payer: Self-pay | Admitting: Otolaryngology

## 2017-12-29 DIAGNOSIS — H905 Unspecified sensorineural hearing loss: Secondary | ICD-10-CM

## 2018-01-01 ENCOUNTER — Telehealth: Payer: Self-pay | Admitting: Family Medicine

## 2018-01-01 NOTE — Telephone Encounter (Unsigned)
Copied from [place] [id number]. Topic: Quick Communication - See Telephone Encounter >> Jan 01, 2018  1:22 PM Percell Belt A wrote: CRM for notification. See Telephone encounter for: 01/01/18.  Pt called in and stated that her ENT DR referred her to get a MRI next week.  The Dr that ordered the mri is out til next Friday.  Her ENT office told her to call her primary to see if he would give her a script for something to take so she can do the mri.  She is worried she will get there and not able to get in it?    Please advise?   Best number  [phone] [facility] [place]

## 2018-01-06 ENCOUNTER — Ambulatory Visit
Admission: RE | Admit: 2018-01-06 | Discharge: 2018-01-06 | Disposition: A | Discharge status: Home / Self Care | Payer: BLUE CROSS/BLUE SHIELD | Source: Ambulatory Visit | Attending: Otolaryngology | Admitting: Otolaryngology

## 2018-01-06 DIAGNOSIS — H905 Unspecified sensorineural hearing loss: Secondary | ICD-10-CM

## 2018-01-06 DIAGNOSIS — H748X1 Other specified disorders of right middle ear and mastoid: Secondary | ICD-10-CM | POA: Diagnosis not present

## 2018-01-06 MED ORDER — GADOBENATE DIMEGLUMINE 529 MG/ML IV SOLN
18.0000 mL | Freq: Once | INTRAVENOUS | Status: AC | PRN
  Administered 2018-01-06: 18 mL via INTRAVENOUS

## 2018-01-06 MED ORDER — LORAZEPAM 0.5 MG PO TABS: ORAL_TABLET | ORAL | 0 refills | Status: DC

## 2018-01-06 NOTE — Telephone Encounter (Signed)
Please advise. Thanks.  

## 2018-01-06 NOTE — Telephone Encounter (Signed)
Left message for pt to call back  °

## 2018-01-06 NOTE — Telephone Encounter (Signed)
Pt calling to f/up. Please advise.  MRI is sched for today at 4pm

## 2018-01-06 NOTE — Telephone Encounter (Signed)
OK, ativan eRx'd.

## 2018-01-06 NOTE — Telephone Encounter (Signed)
Pt advised and voiced understanding.   

## 2018-02-09 DIAGNOSIS — H6981 Other specified disorders of Eustachian tube, right ear: Secondary | ICD-10-CM | POA: Diagnosis not present

## 2018-02-09 DIAGNOSIS — H7091 Unspecified mastoiditis, right ear: Secondary | ICD-10-CM | POA: Diagnosis not present

## 2018-03-10 DIAGNOSIS — H7091 Unspecified mastoiditis, right ear: Secondary | ICD-10-CM | POA: Diagnosis not present

## 2018-03-10 DIAGNOSIS — H6981 Other specified disorders of Eustachian tube, right ear: Secondary | ICD-10-CM | POA: Diagnosis not present

## 2018-04-13 DIAGNOSIS — Z6838 Body mass index (BMI) 38.0-38.9, adult: Secondary | ICD-10-CM | POA: Diagnosis not present

## 2018-04-13 DIAGNOSIS — Z124 Encounter for screening for malignant neoplasm of cervix: Secondary | ICD-10-CM | POA: Diagnosis not present

## 2018-04-13 DIAGNOSIS — Z1231 Encounter for screening mammogram for malignant neoplasm of breast: Secondary | ICD-10-CM | POA: Diagnosis not present

## 2018-04-13 DIAGNOSIS — Z01419 Encounter for gynecological examination (general) (routine) without abnormal findings: Secondary | ICD-10-CM | POA: Diagnosis not present

## 2018-04-13 LAB — HM PAP SMEAR: HM Pap smear: NORMAL

## 2018-04-13 LAB — HM MAMMOGRAPHY

## 2018-04-19 ENCOUNTER — Encounter: Payer: Self-pay | Admitting: Family Medicine

## 2019-04-04 ENCOUNTER — Other Ambulatory Visit: Payer: Self-pay

## 2019-04-04 ENCOUNTER — Ambulatory Visit (INDEPENDENT_AMBULATORY_CARE_PROVIDER_SITE_OTHER): Payer: BC Managed Care – PPO

## 2019-04-04 DIAGNOSIS — Z23 Encounter for immunization: Secondary | ICD-10-CM | POA: Diagnosis not present

## 2019-05-03 DIAGNOSIS — Z6835 Body mass index (BMI) 35.0-35.9, adult: Secondary | ICD-10-CM | POA: Diagnosis not present

## 2019-05-03 DIAGNOSIS — Z01419 Encounter for gynecological examination (general) (routine) without abnormal findings: Secondary | ICD-10-CM | POA: Diagnosis not present

## 2019-05-04 ENCOUNTER — Other Ambulatory Visit: Payer: Self-pay

## 2019-05-09 ENCOUNTER — Other Ambulatory Visit: Payer: Self-pay

## 2019-05-09 ENCOUNTER — Encounter: Payer: Self-pay | Admitting: Family Medicine

## 2019-05-09 ENCOUNTER — Telehealth: Payer: Self-pay

## 2019-05-09 ENCOUNTER — Ambulatory Visit (INDEPENDENT_AMBULATORY_CARE_PROVIDER_SITE_OTHER): Payer: BC Managed Care – PPO | Admitting: Family Medicine

## 2019-05-09 VITALS — BP 127/81 | HR 76 | Temp 98.7°F | Resp 16 | Ht 63.0 in | Wt 219.6 lb

## 2019-05-09 DIAGNOSIS — E669 Obesity, unspecified: Secondary | ICD-10-CM | POA: Diagnosis not present

## 2019-05-09 DIAGNOSIS — Z1211 Encounter for screening for malignant neoplasm of colon: Secondary | ICD-10-CM

## 2019-05-09 DIAGNOSIS — Z87898 Personal history of other specified conditions: Secondary | ICD-10-CM | POA: Diagnosis not present

## 2019-05-09 DIAGNOSIS — Z0000 Encounter for general adult medical examination without abnormal findings: Secondary | ICD-10-CM

## 2019-05-09 LAB — CBC WITH DIFFERENTIAL/PLATELET
Basophils Absolute: 0.1 10*3/uL (ref 0.0–0.1)
Basophils Relative: 1.1 % (ref 0.0–3.0)
Eosinophils Absolute: 0 10*3/uL (ref 0.0–0.7)
Eosinophils Relative: 0.6 % (ref 0.0–5.0)
HCT: 39.8 % (ref 36.0–46.0)
Hemoglobin: 13.2 g/dL (ref 12.0–15.0)
Lymphocytes Relative: 29.5 % (ref 12.0–46.0)
Lymphs Abs: 2.3 10*3/uL (ref 0.7–4.0)
MCHC: 33.1 g/dL (ref 30.0–36.0)
MCV: 93 fl (ref 78.0–100.0)
Monocytes Absolute: 0.5 10*3/uL (ref 0.1–1.0)
Monocytes Relative: 6.8 % (ref 3.0–12.0)
Neutro Abs: 4.9 10*3/uL (ref 1.4–7.7)
Neutrophils Relative %: 62 % (ref 43.0–77.0)
Platelets: 372 10*3/uL (ref 150.0–400.0)
RBC: 4.27 Mil/uL (ref 3.87–5.11)
RDW: 14.3 % (ref 11.5–15.5)
WBC: 7.9 10*3/uL (ref 4.0–10.5)

## 2019-05-09 LAB — COMPREHENSIVE METABOLIC PANEL
ALT: 14 U/L (ref 0–35)
AST: 16 U/L (ref 0–37)
Albumin: 4 g/dL (ref 3.5–5.2)
Alkaline Phosphatase: 59 U/L (ref 39–117)
BUN: 10 mg/dL (ref 6–23)
CO2: 26 mEq/L (ref 19–32)
Calcium: 9.5 mg/dL (ref 8.4–10.5)
Chloride: 100 mEq/L (ref 96–112)
Creatinine, Ser: 0.92 mg/dL (ref 0.40–1.20)
GFR: 64.85 mL/min (ref >60.00)
Glucose, Bld: 89 mg/dL (ref 70–99)
Potassium: 4.5 mEq/L (ref 3.5–5.1)
Sodium: 135 mEq/L (ref 135–145)
Total Bilirubin: 0.6 mg/dL (ref 0.2–1.2)
Total Protein: 7.1 g/dL (ref 6.0–8.3)

## 2019-05-09 LAB — TSH: TSH: 5.22 u[IU]/mL — ABNORMAL HIGH (ref 0.35–4.50)

## 2019-05-09 LAB — LIPID PANEL
Cholesterol: 229 mg/dL — ABNORMAL HIGH (ref 0–200)
HDL: 56.6 mg/dL (ref >39.00)
LDL Cholesterol: 152 mg/dL — ABNORMAL HIGH (ref 0–99)
NonHDL: 172.78
Total CHOL/HDL Ratio: 4
Triglycerides: 102 mg/dL (ref 0.0–149.0)
VLDL: 20.4 mg/dL (ref 0.0–40.0)

## 2019-05-09 NOTE — Progress Notes (Signed)
Office Note 05/09/2019  CC:  Chief Complaint  Patient presents with  . Annual Exam    pt is fasting    HPI:  Gabriella Stephens is a 48 y.o. White female who is here for annual health maintenance exam. Doing fine, no complaints.  No exercise but she is not sedentary. Not eating healthy.  Was on phentermine up until 10/2018, rx'd by her GYN. Considering restart of this med next spring. Gained 18 lbs on our scales since 10/2017.   Past Medical History:  Diagnosis Date  . Anxiety and depression   . Eustachian tube dysfunction, right 10/2017   Persistent (6 wks)-->failed nasal steroid, decongestant, and systemic steroid trial.  ENT to do tympanostomy tube as of 11/10/17.  Marland Kitchen Hepatic hemangioma    small, right side: seen on u/s 2013, no change on CT 2018.  . Obesity, Class II, BMI 35-39.9   . Palpitations 10/2017   47 H Holter with some PAC's and PVCs, brief run of atrial ectopy.  No afib.    Past Surgical History:  Procedure Laterality Date  . 47 HR HOLTER  10/2017   47 H Holter with some PAC's and PVCs, brief run of atrial ectopy.  No afib.  . BUNIONECTOMY     right foot  . CESAREAN SECTION  x 2   1995; 2000  . TONSILLECTOMY  1973   as a child  . TUBAL LIGATION  2001    Family History  Problem Relation Age of Onset  . Thyroid disease Mother   . Hypertension Mother   . Diabetes Mother   . Heart disease Mother   . Hyperlipidemia Mother   . Breast cancer Mother   . Hypertension Father   . Hyperlipidemia Father   . Multiple myeloma Father   . Colon cancer Paternal Grandfather   . Kidney disease Neg Hx   . Asthma Neg Hx   . Stroke Neg Hx   . Esophageal cancer Neg Hx   . Rectal cancer Neg Hx   . Stomach cancer Neg Hx     Social History   Socioeconomic History  . Marital status: Married    Spouse name: Not on file  . Number of children: 3  . Years of education: Not on file  . Highest education level: Not on file  Occupational History  . 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 Financial resource strain: Not on file  . Food insecurity    Worry: Not on file    Inability: Not on file  . Transportation needs    Medical: Not on file    Non-medical: Not on file  Tobacco Use  . Smoking status: Former Smoker    Packs/day: 1.00    Years: 20.00    Pack years: 20.00    Types: Cigarettes    Quit date: 06/10/2011    Years since quitting: 7.9  . Smokeless tobacco: Never Used  Substance and Sexual Activity  . Alcohol use: No    Comment: seldom; once a month  . Drug use: No  . Sexual activity: Not on file  Lifestyle  . Physical activity    Days per week: Not on file    Minutes per session: Not on file  . Stress: Not on file  Relationships  . Social Herbalist on phone: Not on file    Gets together: Not on file    Attends religious  service: Not on file    Active member of club or organization: Not on file    Attends meetings of clubs or organizations: Not on file    Relationship status: Not on file  . Intimate partner violence    Fear of current or ex partner: Not on file    Emotionally abused: Not on file    Physically abused: Not on file    Forced sexual activity: Not on file  Other Topics Concern  . Not on file  Social History Narrative   Married, 3 children.  Lives in [place].   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   Tob: 20 pack-yr hx, quit 2013.   Alc: rare.   No drugs.       Outpatient Medications Prior to Visit  Medication Sig Dispense Refill  . fexofenadine (ALLEGRA) 180 MG tablet Take 180 mg by mouth daily.    . fluticasone (FLONASE) 50 MCG/ACT nasal spray Place 2 sprays into both nostrils daily. (Patient not taking: Reported on 05/09/2019) 16 g 6  . LORazepam (ATIVAN) 0.5 MG tablet 1-2 tabs by mouth every 8 hours as needed for anxiety.  Take approx 1-2 hours prior to your upcoming MRI. (Patient not taking: Reported on  05/09/2019) 10 tablet 0  . predniSONE (DELTASONE) 20 MG tablet 2 tabs po qd x 5d 10 tablet 0   No facility-administered medications prior to visit.     Allergies  Allergen Reactions  . Penicillins Hives    ROS Review of Systems  Constitutional: Negative for appetite change, chills, fatigue and fever.  HENT: Negative for congestion, dental problem, ear pain and sore throat.   Eyes: Negative for discharge, redness and visual disturbance.  Respiratory: Negative for cough, chest tightness, shortness of breath and wheezing.   Cardiovascular: Negative for chest pain, palpitations and leg swelling.  Gastrointestinal: Negative for abdominal pain, blood in stool, diarrhea, nausea and vomiting.  Genitourinary: Negative for difficulty urinating, dysuria, flank pain, frequency, hematuria and urgency.  Musculoskeletal: Negative for arthralgias, back pain, joint swelling, myalgias and neck stiffness.  Skin: Negative for pallor and rash.  Neurological: Negative for dizziness, speech difficulty, weakness and headaches.  Hematological: Negative for adenopathy. Does not bruise/bleed easily.  Psychiatric/Behavioral: Negative for confusion and sleep disturbance. The patient is not nervous/anxious.     PE; Blood pressure 127/81, pulse 76, temperature 98.7 F (37.1 C), temperature source Temporal, resp. rate 16, height 5' 3"  (1.6 m), weight 219 lb 9.6 oz (99.6 kg), last menstrual period 04/18/2019, SpO2 98 %. Body mass index is 38.9 kg/m. Exam chaperoned by Deveron Furlong, CMA.  Gen: Alert, well appearing.  Patient is oriented to person, place, time, and situation. AFFECT: pleasant, lucid thought and speech. ENT: Ears: EACs clear, normal epithelium.  TMs with good light reflex and landmarks bilaterally.  Eyes: no injection, icteris, swelling, or exudate.  EOMI, PERRLA. Nose: no drainage or turbinate edema/swelling.  No injection or focal lesion.  Mouth: lips without lesion/swelling.  Oral mucosa pink  and moist.  Dentition intact and without obvious caries or gingival swelling.  Oropharynx without erythema, exudate, or swelling.  Neck: supple/nontender.  No LAD, mass, or TM.  Carotid pulses 2+ bilaterally, without bruits. CV: RRR, no m/r/g.   LUNGS: CTA bilat, nonlabored resps, good aeration in all lung fields. ABD: soft, NT, ND, BS normal.  No hepatospenomegaly or mass.  No bruits. EXT: no clubbing, cyanosis, or edema.  Musculoskeletal: no joint swelling, erythema, warmth,  or tenderness.  ROM of all joints intact. Skin - no sores or suspicious lesions or rashes or color changes   Pertinent labs:  Lab Results  Component Value Date   TSH 2.39 10/05/2017   Lab Results  Component Value Date   WBC 6.2 04/29/2017   HGB 14.0 04/29/2017   HCT 42.8 04/29/2017   MCV 95.1 04/29/2017   PLT 338.0 04/29/2017   Lab Results  Component Value Date   CREATININE 0.74 10/05/2017   BUN 14 10/05/2017   NA 139 10/05/2017   K 4.3 10/05/2017   CL 101 10/05/2017   CO2 28 10/05/2017   Lab Results  Component Value Date   ALT 12 04/29/2017   AST 16 04/29/2017   ALKPHOS 51 04/29/2017   BILITOT 0.7 04/29/2017   Lab Results  Component Value Date   CHOL 200 04/24/2015   Lab Results  Component Value Date   HDL 53.00 04/24/2015   Lab Results  Component Value Date   LDLCALC 123 (H) 04/24/2015   Lab Results  Component Value Date   TRIG 121.0 04/24/2015   Lab Results  Component Value Date   CHOLHDL 4 04/24/2015     ASSESSMENT AND PLAN:   Health maintenance exam: Reviewed age and gender appropriate health maintenance issues (prudent diet, regular exercise, health risks of tobacco and excessive alcohol, use of seatbelts, fire alarms in home, use of sunscreen).  Also reviewed age and gender appropriate health screening as well as vaccine recommendations. Vaccines: all UTD.  Shingrix after age 77. Labs: fasting HP ordered. Cervical ca screening: [facility] (Dr. Tracey Harries recent  pap last year, no pap this year (got exam a couple weeks ago). Breast ca screening: last mammogram 04/2018 in EMR.  Annual screening mammogram ->Scheduled for 05/20/19 at [facility] (Dr. Stann Mainland). Colon ca screening: has not had initial screening colonoscopy->referred to GI today.  An After Visit Summary was printed and given to the patient.  FOLLOW UP:  Return in about 1 year (around 05/08/2020) for annual CPE (fasting).  Signed:  Crissie Sickles, MD           05/09/2019

## 2019-05-09 NOTE — Patient Instructions (Signed)
Health Maintenance, Female Adopting a healthy lifestyle and getting preventive care are important in promoting health and wellness. Ask your health care provider about:  The right schedule for you to have regular tests and exams.  Things you can do on your own to prevent diseases and keep yourself healthy. What should I know about diet, weight, and exercise? Eat a healthy diet   Eat a diet that includes plenty of vegetables, fruits, low-fat dairy products, and lean protein.  Do not eat a lot of foods that are high in solid fats, added sugars, or sodium. Maintain a healthy weight Body mass index (BMI) is used to identify weight problems. It estimates body fat based on height and weight. Your health care provider can help determine your BMI and help you achieve or maintain a healthy weight. Get regular exercise Get regular exercise. This is one of the most important things you can do for your health. Most adults should:  Exercise for at least 150 minutes each week. The exercise should increase your heart rate and make you sweat (moderate-intensity exercise).  Do strengthening exercises at least twice a week. This is in addition to the moderate-intensity exercise.  Spend less time sitting. Even light physical activity can be beneficial. Watch cholesterol and blood lipids Have your blood tested for lipids and cholesterol at 49 years of age, then have this test every 5 years. Have your cholesterol levels checked more often if:  Your lipid or cholesterol levels are high.  You are older than 49 years of age.  You are at high risk for heart disease. What should I know about cancer screening? Depending on your health history and family history, you may need to have cancer screening at various ages. This may include screening for:  Breast cancer.  Cervical cancer.  Colorectal cancer.  Skin cancer.  Lung cancer. What should I know about heart disease, diabetes, and high blood  pressure? Blood pressure and heart disease  High blood pressure causes heart disease and increases the risk of stroke. This is more likely to develop in people who have high blood pressure readings, are of African descent, or are overweight.  Have your blood pressure checked: ? Every 3-5 years if you are 18-39 years of age. ? Every year if you are 40 years old or older. Diabetes Have regular diabetes screenings. This checks your fasting blood sugar level. Have the screening done:  Once every three years after age 40 if you are at a normal weight and have a low risk for diabetes.  More often and at a younger age if you are overweight or have a high risk for diabetes. What should I know about preventing infection? Hepatitis B If you have a higher risk for hepatitis B, you should be screened for this virus. Talk with your health care provider to find out if you are at risk for hepatitis B infection. Hepatitis C Testing is recommended for:  Everyone born from 1945 through 1965.  Anyone with known risk factors for hepatitis C. Sexually transmitted infections (STIs)  Get screened for STIs, including gonorrhea and chlamydia, if: ? You are sexually active and are younger than 49 years of age. ? You are older than 49 years of age and your health care provider tells you that you are at risk for this type of infection. ? Your sexual activity has changed since you were last screened, and you are at increased risk for chlamydia or gonorrhea. Ask your health care provider if   you are at risk.  Ask your health care provider about whether you are at high risk for HIV. Your health care provider may recommend a prescription medicine to help prevent HIV infection. If you choose to take medicine to prevent HIV, you should first get tested for HIV. You should then be tested every 3 months for as long as you are taking the medicine. Pregnancy  If you are about to stop having your period (premenopausal) and  you may become pregnant, seek counseling before you get pregnant.  Take 400 to 800 micrograms (mcg) of folic acid every day if you become pregnant.  Ask for birth control (contraception) if you want to prevent pregnancy. Osteoporosis and menopause Osteoporosis is a disease in which the bones lose minerals and strength with aging. This can result in bone fractures. If you are 65 years old or older, or if you are at risk for osteoporosis and fractures, ask your health care provider if you should:  Be screened for bone loss.  Take a calcium or vitamin D supplement to lower your risk of fractures.  Be given hormone replacement therapy (HRT) to treat symptoms of menopause. Follow these instructions at home: Lifestyle  Do not use any products that contain nicotine or tobacco, such as cigarettes, e-cigarettes, and chewing tobacco. If you need help quitting, ask your health care provider.  Do not use street drugs.  Do not share needles.  Ask your health care provider for help if you need support or information about quitting drugs. Alcohol use  Do not drink alcohol if: ? Your health care provider tells you not to drink. ? You are pregnant, may be pregnant, or are planning to become pregnant.  If you drink alcohol: ? Limit how much you use to 0-1 drink a day. ? Limit intake if you are breastfeeding.  Be aware of how much alcohol is in your drink. In the [place] one drink equals one 12 oz bottle of beer (355 mL), one 5 oz glass of wine (148 mL), or one 1 oz glass of hard liquor (44 mL). General instructions  Schedule regular health, dental, and eye exams.  Stay current with your vaccines.  Tell your health care provider if: ? You often feel depressed. ? You have ever been abused or do not feel safe at home. Summary  Adopting a healthy lifestyle and getting preventive care are important in promoting health and wellness.  Follow your health care provider's instructions about healthy  diet, exercising, and getting tested or screened for diseases.  Follow your health care provider's instructions on monitoring your cholesterol and blood pressure. This information is not intended to replace advice given to you by your health care provider. Make sure you discuss any questions you have with your health care provider. Document Released: 12/09/2010 Document Revised: 05/19/2018 Document Reviewed: 05/19/2018 Elsevier Patient Education  2020 Elsevier Inc.  

## 2019-05-09 NOTE — Telephone Encounter (Signed)
Patient had appt today, 05/09/19 and brought a health form to be completed once lab results are back. Form is on my desk until then.

## 2019-05-10 ENCOUNTER — Other Ambulatory Visit: Payer: Self-pay | Admitting: Family Medicine

## 2019-05-10 ENCOUNTER — Other Ambulatory Visit (INDEPENDENT_AMBULATORY_CARE_PROVIDER_SITE_OTHER): Payer: BC Managed Care – PPO

## 2019-05-10 DIAGNOSIS — E038 Other specified hypothyroidism: Secondary | ICD-10-CM

## 2019-05-10 DIAGNOSIS — E7800 Pure hypercholesterolemia, unspecified: Secondary | ICD-10-CM

## 2019-05-10 DIAGNOSIS — R7989 Other specified abnormal findings of blood chemistry: Secondary | ICD-10-CM

## 2019-05-10 DIAGNOSIS — E039 Hypothyroidism, unspecified: Secondary | ICD-10-CM

## 2019-05-10 HISTORY — DX: Other specified hypothyroidism: E03.8

## 2019-05-10 HISTORY — DX: Hypothyroidism, unspecified: E03.9

## 2019-05-10 HISTORY — DX: Pure hypercholesterolemia, unspecified: E78.00

## 2019-05-10 LAB — T4, FREE: Free T4: 0.85 ng/dL (ref 0.60–1.60)

## 2019-05-11 ENCOUNTER — Encounter: Payer: Self-pay | Admitting: Family Medicine

## 2019-05-11 LAB — T3: T3, Total: 112 ng/dL (ref 76–181)

## 2019-05-12 ENCOUNTER — Other Ambulatory Visit: Payer: Self-pay

## 2019-05-12 DIAGNOSIS — R7989 Other specified abnormal findings of blood chemistry: Secondary | ICD-10-CM

## 2019-05-20 DIAGNOSIS — Z1231 Encounter for screening mammogram for malignant neoplasm of breast: Secondary | ICD-10-CM | POA: Diagnosis not present

## 2019-05-23 LAB — HM MAMMOGRAPHY

## 2019-05-30 ENCOUNTER — Encounter: Payer: Self-pay | Admitting: Family Medicine

## 2019-11-10 ENCOUNTER — Other Ambulatory Visit: Payer: Self-pay

## 2019-11-10 ENCOUNTER — Ambulatory Visit (INDEPENDENT_AMBULATORY_CARE_PROVIDER_SITE_OTHER): Payer: BC Managed Care – PPO | Admitting: Family Medicine

## 2019-11-10 DIAGNOSIS — R946 Abnormal results of thyroid function studies: Secondary | ICD-10-CM | POA: Diagnosis not present

## 2019-11-10 DIAGNOSIS — R7989 Other specified abnormal findings of blood chemistry: Secondary | ICD-10-CM | POA: Diagnosis not present

## 2019-11-10 LAB — T4, FREE: Free T4: 0.83 ng/dL (ref 0.60–1.60)

## 2019-11-10 LAB — TSH: TSH: 4.17 u[IU]/mL (ref 0.35–4.50)

## 2019-11-11 LAB — T3: T3, Total: 110 ng/dL (ref 76–181)

## 2020-03-19 ENCOUNTER — Other Ambulatory Visit: Payer: Self-pay

## 2020-03-19 ENCOUNTER — Ambulatory Visit (INDEPENDENT_AMBULATORY_CARE_PROVIDER_SITE_OTHER): Payer: BC Managed Care – PPO

## 2020-03-19 ENCOUNTER — Ambulatory Visit: Payer: BC Managed Care – PPO

## 2020-03-19 DIAGNOSIS — Z23 Encounter for immunization: Secondary | ICD-10-CM

## 2020-05-10 ENCOUNTER — Encounter: Payer: BC Managed Care – PPO | Admitting: Family Medicine

## 2020-05-16 ENCOUNTER — Encounter: Payer: Self-pay | Admitting: Family Medicine

## 2020-05-16 ENCOUNTER — Other Ambulatory Visit: Payer: Self-pay

## 2020-05-16 ENCOUNTER — Ambulatory Visit (INDEPENDENT_AMBULATORY_CARE_PROVIDER_SITE_OTHER): Payer: BC Managed Care – PPO | Admitting: Family Medicine

## 2020-05-16 VITALS — BP 125/77 | HR 70 | Temp 97.9°F | Resp 16 | Ht 62.0 in | Wt 213.8 lb

## 2020-05-16 DIAGNOSIS — Z0000 Encounter for general adult medical examination without abnormal findings: Secondary | ICD-10-CM

## 2020-05-16 DIAGNOSIS — R7989 Other specified abnormal findings of blood chemistry: Secondary | ICD-10-CM | POA: Diagnosis not present

## 2020-05-16 DIAGNOSIS — Z23 Encounter for immunization: Secondary | ICD-10-CM | POA: Diagnosis not present

## 2020-05-16 DIAGNOSIS — F411 Generalized anxiety disorder: Secondary | ICD-10-CM | POA: Diagnosis not present

## 2020-05-16 DIAGNOSIS — F410 Panic disorder [episodic paroxysmal anxiety] without agoraphobia: Secondary | ICD-10-CM

## 2020-05-16 DIAGNOSIS — E785 Hyperlipidemia, unspecified: Secondary | ICD-10-CM | POA: Diagnosis not present

## 2020-05-16 DIAGNOSIS — E66812 Obesity, class 2: Secondary | ICD-10-CM

## 2020-05-16 DIAGNOSIS — Z1211 Encounter for screening for malignant neoplasm of colon: Secondary | ICD-10-CM | POA: Diagnosis not present

## 2020-05-16 DIAGNOSIS — E669 Obesity, unspecified: Secondary | ICD-10-CM | POA: Diagnosis not present

## 2020-05-16 DIAGNOSIS — F5101 Primary insomnia: Secondary | ICD-10-CM

## 2020-05-16 LAB — T4, FREE: Free T4: 0.77 ng/dL (ref 0.60–1.60)

## 2020-05-16 LAB — COMPREHENSIVE METABOLIC PANEL
ALT: 14 U/L (ref 0–35)
AST: 16 U/L (ref 0–37)
Albumin: 4.3 g/dL (ref 3.5–5.2)
Alkaline Phosphatase: 60 U/L (ref 39–117)
BUN: 14 mg/dL (ref 6–23)
CO2: 27 mEq/L (ref 19–32)
Calcium: 9.3 mg/dL (ref 8.4–10.5)
Chloride: 103 mEq/L (ref 96–112)
Creatinine, Ser: 0.96 mg/dL (ref 0.40–1.20)
GFR: 69.17 mL/min (ref >60.00)
Glucose, Bld: 95 mg/dL (ref 70–99)
Potassium: 4.4 mEq/L (ref 3.5–5.1)
Sodium: 138 mEq/L (ref 135–145)
Total Bilirubin: 0.6 mg/dL (ref 0.2–1.2)
Total Protein: 7.3 g/dL (ref 6.0–8.3)

## 2020-05-16 LAB — CBC WITH DIFFERENTIAL/PLATELET
Basophils Absolute: 0.1 10*3/uL (ref 0.0–0.1)
Basophils Relative: 1 % (ref 0.0–3.0)
Eosinophils Absolute: 0.1 10*3/uL (ref 0.0–0.7)
Eosinophils Relative: 0.9 % (ref 0.0–5.0)
HCT: 39.4 % (ref 36.0–46.0)
Hemoglobin: 13 g/dL (ref 12.0–15.0)
Lymphocytes Relative: 28.1 % (ref 12.0–46.0)
Lymphs Abs: 2 10*3/uL (ref 0.7–4.0)
MCHC: 33.1 g/dL (ref 30.0–36.0)
MCV: 90.6 fl (ref 78.0–100.0)
Monocytes Absolute: 0.6 10*3/uL (ref 0.1–1.0)
Monocytes Relative: 8.1 % (ref 3.0–12.0)
Neutro Abs: 4.3 10*3/uL (ref 1.4–7.7)
Neutrophils Relative %: 61.9 % (ref 43.0–77.0)
Platelets: 411 10*3/uL — ABNORMAL HIGH (ref 150.0–400.0)
RBC: 4.35 Mil/uL (ref 3.87–5.11)
RDW: 16.2 % — ABNORMAL HIGH (ref 11.5–15.5)
WBC: 7 10*3/uL (ref 4.0–10.5)

## 2020-05-16 LAB — TSH: TSH: 2.92 u[IU]/mL (ref 0.35–4.50)

## 2020-05-16 MED ORDER — SERTRALINE HCL 50 MG PO TABS: ORAL_TABLET | ORAL | 0 refills | Status: DC

## 2020-05-16 NOTE — Progress Notes (Signed)
Office Note 05/16/2020  CC:  Chief Complaint  Patient presents with  . Annual Exam    pt is fasting    HPI:  Gabriella Stephens is a 49 y.o. White female who is here for annual health maintenance exam. A/P as of last visit: "Health maintenance exam: Reviewed age and gender appropriate health maintenance issues (prudent diet, regular exercise, health risks of tobacco and excessive alcohol, use of seatbelts, fire alarms in home, use of sunscreen).  Also reviewed age and gender appropriate health screening as well as vaccine recommendations. Vaccines: all UTD.  Shingrix after age 86. Labs: fasting HP ordered. Cervical ca screening: [facility] (Dr. Tracey Harries recent pap last year, no pap this year (got exam a couple weeks ago). Breast ca screening: last mammogram 04/2018 in EMR.  Annual screening mammogram ->Scheduled for 05/20/19 at [facility] (Dr. Stann Mainland). Colon ca screening: has not had initial screening colonoscopy->referred to GI today."  INTERIM HX: Feels irritated easily lately--at least the last 6 mo, nothing persistent or prolonged but at the same time says she wants to try med treatment for chronic anxiety.  Long hx of "Worries about everything".  Has had some periods of panic, usually mild/mod intensity. Sleep has always been interrupted.  Has taken otc benadryl sleep aid for the last week and it's helpful, no side effects. No persistent sadness, hopelessness, or anhedonia. Only occ alc.   Dulox trial back in August 14, 2015 around the time her mom died and she doesn't recall much about this med, took for only about 6 mo.  No benzos.  Labs 1 mo ago at work, reviewed today: Gluc 84, HDL 63, trigs 107, LDL 138, Tchol 222, chol/hdl ratio 3.5,    Past Medical History:  Diagnosis Date  . Anxiety and depression   . Eustachian tube dysfunction, right 10/2017   Persistent (6 wks)-->failed nasal steroid, decongestant, and systemic steroid trial.  ENT to do tympanostomy tube  as of 11/10/17.  Marland Kitchen Hepatic hemangioma    small, right side: seen on u/s 08-14-2011, no change on CT 2016/08/13.  Marland Kitchen Hypercholesterolemia 05/2019   Mild->TLC  . Obesity, Class II, BMI 35-39.9   . Palpitations 10/2017   48 H Holter with some PAC's and PVCs, brief run of atrial ectopy.  No afib.  . Subclinical hypothyroidism 05/2019   TSH around 5. Plan rechk labs 6 mo.    Past Surgical History:  Procedure Laterality Date  . 48 HR HOLTER  10/2017   47 H Holter with some PAC's and PVCs, brief run of atrial ectopy.  No afib.  . BUNIONECTOMY     right foot  . CESAREAN SECTION  x 2   1995; August 13, 1998  . TONSILLECTOMY  1973   as a child  . TUBAL LIGATION  1999/08/14    Family History  Problem Relation Age of Onset  . Thyroid disease Mother   . Hypertension Mother   . Diabetes Mother   . Heart disease Mother   . Hyperlipidemia Mother   . Breast cancer Mother   . Hypertension Father   . Hyperlipidemia Father   . Multiple myeloma Father   . Colon cancer Paternal Grandfather   . Kidney disease Neg Hx   . Asthma Neg Hx   . Stroke Neg Hx   . Esophageal cancer Neg Hx   . Rectal cancer Neg Hx   . Stomach cancer Neg Hx     Social History   Socioeconomic History  . Marital status: Married  Spouse name: Not on file  . Number of children: 3  . Years of education: Not on file  . Highest education level: Not on file  Occupational History  . Occupation: Best boy: [employer]    Employer: [employer]  Tobacco Use  . Smoking status: Former Smoker    Packs/day: 1.00    Years: 20.00    Pack years: 20.00    Types: Cigarettes    Quit date: 06/10/2011    Years since quitting: 8.9  . Smokeless tobacco: Never Used  Vaping Use  . Vaping Use: Never used  Substance and Sexual Activity  . Alcohol use: No    Comment: seldom; once a month  . Drug use: No  . Sexual activity: Not on file  Other Topics Concern  . Not on file  Social History Narrative   Married, 3 children.  Lives in [place].   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   Tob: 20 pack-yr hx, quit 2013.   Alc: rare.   No drugs.      Social Determinants of Health   Financial Resource Strain:   . Difficulty of Paying Living Expenses: Not on file  Food Insecurity:   . Worried About Charity fundraiser in the Last Year: Not on file  . Ran Out of Food in the Last Year: Not on file  Transportation Needs:   . Lack of Transportation (Medical): Not on file  . Lack of Transportation (Non-Medical): Not on file  Physical Activity:   . Days of Exercise per Week: Not on file  . Minutes of Exercise per Session: Not on file  Stress:   . Feeling of Stress : Not on file  Social Connections:   . Frequency of Communication with Friends and Family: Not on file  . Frequency of Social Gatherings with Friends and Family: Not on file  . Attends Religious Services: Not on file  . Active Member of Clubs or Organizations: Not on file  . Attends Archivist Meetings: Not on file  . Marital Status: Not on file  Intimate Partner Violence:   . Fear of Current or Ex-Partner: Not on file  . Emotionally Abused: Not on file  . Physically Abused: Not on file  . Sexually Abused: Not on file    No outpatient medications prior to visit.   No facility-administered medications prior to visit.    Allergies  Allergen Reactions  . Penicillins Hives    ROS Review of Systems  Constitutional: Negative for appetite change, chills, fatigue and fever.  HENT: Negative for congestion, dental problem, ear pain and sore throat.   Eyes: Negative for discharge, redness and visual disturbance.  Respiratory: Negative for cough, chest tightness, shortness of breath and wheezing.   Cardiovascular: Negative for chest pain, palpitations and leg swelling.  Gastrointestinal: Negative for abdominal pain, blood in stool, diarrhea, nausea and vomiting.  Genitourinary: Negative for difficulty urinating, dysuria, flank pain,  frequency, hematuria and urgency.  Musculoskeletal: Negative for arthralgias, back pain, joint swelling, myalgias and neck stiffness.  Skin: Negative for pallor and rash.  Neurological: Negative for dizziness, speech difficulty, weakness and headaches.  Hematological: Negative for adenopathy. Does not bruise/bleed easily.  Psychiatric/Behavioral: Positive for sleep disturbance. Negative for confusion. The patient is nervous/anxious.     PE; Vitals with BMI 05/16/2020 05/09/2019 10/09/2017  Height _0  _1  _2   Weight 213 lbs 13 oz 219 lbs 10 oz  201 lbs 8 oz  BMI 39.09 75.91 63.8  Systolic 466 599 357  Diastolic 77 81 72  Pulse 70 76 68     Exam chaperoned by Deveron Furlong, CMA. Gen: Alert, well appearing.  Patient is oriented to person, place, time, and situation. AFFECT: pleasant, lucid thought and speech. ENT: Ears: EACs clear, normal epithelium.  TMs with good light reflex and landmarks bilaterally.  Eyes: no injection, icteris, swelling, or exudate.  EOMI, PERRLA. Nose: no drainage or turbinate edema/swelling.  No injection or focal lesion.  Mouth: lips without lesion/swelling.  Oral mucosa pink and moist.  Dentition intact and without obvious caries or gingival swelling.  Oropharynx without erythema, exudate, or swelling.  Neck: supple/nontender.  No LAD, mass, or TM.  Carotid pulses 2+ bilaterally, without bruits. CV: RRR, no m/r/g.   LUNGS: CTA bilat, nonlabored resps, good aeration in all lung fields. ABD: soft, NT, ND, BS normal.  No hepatospenomegaly or mass.  No bruits. EXT: no clubbing, cyanosis, or edema.  Musculoskeletal: no joint swelling, erythema, warmth, or tenderness.  ROM of all joints intact. Skin - no sores or suspicious lesions or rashes or color changes   Pertinent labs:  Lab Results  Component Value Date   TSH 4.17 11/10/2019   Lab Results  Component Value Date   WBC 7.9 05/09/2019   HGB 13.2 05/09/2019   HCT 39.8 05/09/2019   MCV 93.0  05/09/2019   PLT 372.0 05/09/2019   Lab Results  Component Value Date   CREATININE 0.92 05/09/2019   BUN 10 05/09/2019   NA 135 05/09/2019   K 4.5 05/09/2019   CL 100 05/09/2019   CO2 26 05/09/2019   Lab Results  Component Value Date   ALT 14 05/09/2019   AST 16 05/09/2019   ALKPHOS 59 05/09/2019   BILITOT 0.6 05/09/2019   Lab Results  Component Value Date   CHOL 229 (H) 05/09/2019   Lab Results  Component Value Date   HDL 56.60 05/09/2019   Lab Results  Component Value Date   LDLCALC 152 (H) 05/09/2019   Lab Results  Component Value Date   TRIG 102.0 05/09/2019   Lab Results  Component Value Date   CHOLHDL 4 05/09/2019    ASSESSMENT AND PLAN:   1) GAD, panic attacks, sleep disturbance. Start sertraline 8m, 1 tab qd, inc to 2 tabs qd in 10d. Therapeutic expectations and side effect profile of medication discussed today.  Patient's questions answered. F/u 1 mo.  2) Mild hyperlipidemia:  See HPI for recent lipid panel numbers. 10 yr frmhm risk=1%. TLC discussed, rpt chol 1 yr.  3) Health maintenance exam: Reviewed age and gender appropriate health maintenance issues (prudent diet, regular exercise, health risks of tobacco and excessive alcohol, use of seatbelts, fire alarms in home, use of sunscreen).  Also reviewed age and gender appropriate health screening as well as vaccine recommendations. Vaccines:  Covid 19->UTD. Flu-->UTD.  Shingrix->#1 today. Labs:fasting CMET, Thyroid, CBC.  FLP done via employer--see hpi above. C[facility]  [facility]him in 1 wk. [facility] Colon ca screening: referred to GI for screening at [facility] 1 yr ago->ins wouldn't pay until age [id number]-->referred again today.  An After Visit Summary was printed and given to the patient.  FOLLOW UP:  Return in about 4 weeks (around 06/13/2020) for f/u anx/med.  Signed:  PCrissie Sickles MD  05/16/2020

## 2020-05-16 NOTE — Patient Instructions (Signed)
Health Maintenance, Female Adopting a healthy lifestyle and getting preventive care are important in promoting health and wellness. Ask your health care provider about:  The right schedule for you to have regular tests and exams.  Things you can do on your own to prevent diseases and keep yourself healthy. What should I know about diet, weight, and exercise? Eat a healthy diet   Eat a diet that includes plenty of vegetables, fruits, low-fat dairy products, and lean protein.  Do not eat a lot of foods that are high in solid fats, added sugars, or sodium. Maintain a healthy weight Body mass index (BMI) is used to identify weight problems. It estimates body fat based on height and weight. Your health care provider can help determine your BMI and help you achieve or maintain a healthy weight. Get regular exercise Get regular exercise. This is one of the most important things you can do for your health. Most adults should:  Exercise for at least 150 minutes each week. The exercise should increase your heart rate and make you sweat (moderate-intensity exercise).  Do strengthening exercises at least twice a week. This is in addition to the moderate-intensity exercise.  Spend less time sitting. Even light physical activity can be beneficial. Watch cholesterol and blood lipids Have your blood tested for lipids and cholesterol at 50 years of age, then have this test every 5 years. Have your cholesterol levels checked more often if:  Your lipid or cholesterol levels are high.  You are older than 50 years of age.  You are at high risk for heart disease. What should I know about cancer screening? Depending on your health history and family history, you may need to have cancer screening at various ages. This may include screening for:  Breast cancer.  Cervical cancer.  Colorectal cancer.  Skin cancer.  Lung cancer. What should I know about heart disease, diabetes, and high blood  pressure? Blood pressure and heart disease  High blood pressure causes heart disease and increases the risk of stroke. This is more likely to develop in people who have high blood pressure readings, are of African descent, or are overweight.  Have your blood pressure checked: ? Every 3-5 years if you are 18-39 years of age. ? Every year if you are 40 years old or older. Diabetes Have regular diabetes screenings. This checks your fasting blood sugar level. Have the screening done:  Once every three years after age 40 if you are at a normal weight and have a low risk for diabetes.  More often and at a younger age if you are overweight or have a high risk for diabetes. What should I know about preventing infection? Hepatitis B If you have a higher risk for hepatitis B, you should be screened for this virus. Talk with your health care provider to find out if you are at risk for hepatitis B infection. Hepatitis C Testing is recommended for:  Everyone born from 1945 through 1965.  Anyone with known risk factors for hepatitis C. Sexually transmitted infections (STIs)  Get screened for STIs, including gonorrhea and chlamydia, if: ? You are sexually active and are younger than 50 years of age. ? You are older than 50 years of age and your health care provider tells you that you are at risk for this type of infection. ? Your sexual activity has changed since you were last screened, and you are at increased risk for chlamydia or gonorrhea. Ask your health care provider if   you are at risk.  Ask your health care provider about whether you are at high risk for HIV. Your health care provider may recommend a prescription medicine to help prevent HIV infection. If you choose to take medicine to prevent HIV, you should first get tested for HIV. You should then be tested every 3 months for as long as you are taking the medicine. Pregnancy  If you are about to stop having your period (premenopausal) and  you may become pregnant, seek counseling before you get pregnant.  Take 400 to 800 micrograms (mcg) of folic acid every day if you become pregnant.  Ask for birth control (contraception) if you want to prevent pregnancy. Osteoporosis and menopause Osteoporosis is a disease in which the bones lose minerals and strength with aging. This can result in bone fractures. If you are 65 years old or older, or if you are at risk for osteoporosis and fractures, ask your health care provider if you should:  Be screened for bone loss.  Take a calcium or vitamin D supplement to lower your risk of fractures.  Be given hormone replacement therapy (HRT) to treat symptoms of menopause. Follow these instructions at home: Lifestyle  Do not use any products that contain nicotine or tobacco, such as cigarettes, e-cigarettes, and chewing tobacco. If you need help quitting, ask your health care provider.  Do not use street drugs.  Do not share needles.  Ask your health care provider for help if you need support or information about quitting drugs. Alcohol use  Do not drink alcohol if: ? Your health care provider tells you not to drink. ? You are pregnant, may be pregnant, or are planning to become pregnant.  If you drink alcohol: ? Limit how much you use to 0-1 drink a day. ? Limit intake if you are breastfeeding.  Be aware of how much alcohol is in your drink. In the [place] one drink equals one 12 oz bottle of beer (355 mL), one 5 oz glass of wine (148 mL), or one 1 oz glass of hard liquor (44 mL). General instructions  Schedule regular health, dental, and eye exams.  Stay current with your vaccines.  Tell your health care provider if: ? You often feel depressed. ? You have ever been abused or do not feel safe at home. Summary  Adopting a healthy lifestyle and getting preventive care are important in promoting health and wellness.  Follow your health care provider's instructions about healthy  diet, exercising, and getting tested or screened for diseases.  Follow your health care provider's instructions on monitoring your cholesterol and blood pressure. This information is not intended to replace advice given to you by your health care provider. Make sure you discuss any questions you have with your health care provider. Document Revised: 05/19/2018 Document Reviewed: 05/19/2018 Elsevier Patient Education  2020 Elsevier Inc.  

## 2020-05-16 NOTE — Addendum Note (Signed)
Addended by: Deveron Furlong D on: 05/16/2020 09:48 AM   Modules accepted: Orders

## 2020-05-17 ENCOUNTER — Telehealth: Payer: Self-pay | Admitting: Family Medicine

## 2020-05-17 LAB — T3: T3, Total: 118 ng/dL (ref 76–181)

## 2020-05-17 NOTE — Telephone Encounter (Signed)
Patient returned Toria's call regarding lab results. I relayed Dr. Idelle Leech message that all labs came back normal. Patient verbalized understanding.

## 2020-05-17 NOTE — Telephone Encounter (Signed)
Noted  

## 2020-06-14 ENCOUNTER — Ambulatory Visit (INDEPENDENT_AMBULATORY_CARE_PROVIDER_SITE_OTHER): Payer: BC Managed Care – PPO | Admitting: Family Medicine

## 2020-06-14 ENCOUNTER — Other Ambulatory Visit: Payer: Self-pay

## 2020-06-14 ENCOUNTER — Encounter: Payer: Self-pay | Admitting: Family Medicine

## 2020-06-14 VITALS — BP 122/84 | HR 80 | Temp 98.1°F | Resp 16 | Ht 62.0 in | Wt 215.6 lb

## 2020-06-14 DIAGNOSIS — F5105 Insomnia due to other mental disorder: Secondary | ICD-10-CM

## 2020-06-14 DIAGNOSIS — F99 Mental disorder, not otherwise specified: Secondary | ICD-10-CM

## 2020-06-14 DIAGNOSIS — F411 Generalized anxiety disorder: Secondary | ICD-10-CM

## 2020-06-14 MED ORDER — TEMAZEPAM 15 MG PO CAPS: ORAL_CAPSULE | ORAL | 0 refills | Status: DC

## 2020-06-14 MED ORDER — SERTRALINE HCL 100 MG PO TABS: 100.0000 mg | ORAL_TABLET | Freq: Every day | ORAL | 3 refills | Status: DC

## 2020-06-14 NOTE — Progress Notes (Signed)
See student note from this date. Signed:  Santiago Bumpers, MD           06/14/2020

## 2020-06-14 NOTE — Progress Notes (Signed)
CC: f/u anxiety and sleep disturbance  HPI:  Gabriella Stephens is a 50 yo female who presents to the clinic today for a 4-week follow up for generalized anxiety, panic attacks, and sleep disturbance.  At last visit (05/16/2020), patient was started on sertraline 50 mg qhs with instructions that she may titrate up to  100 mg qhs after 10 days.  Today, patient reports that anxiety has improved significantly since starting the sertraline. She states that she has titrated up to 100 mg qhs as instructed last visit. Patient denies any nausea, vomiting, dizziness, drowsiness, or suicidal ideation. Patient denies feeling anxious at this time. She also denies any recent panic attacks.  Patient reports that she is still experiencing sleep disturbance. She reports that she does not have any difficulty falling asleep, however, a few hours after falling asleep she will wake up. She reports that she has some difficulty falling back asleep during these episodes. She states that she does not feel anxious or stressed during this episode, and cannot really pinpoint a cause as to why she may be waking up in the middle of the night. Patient states that she wakes up feeling rested everyday, including when she has these episodes where she will wake up in the middle of the night. She denies waking up feeling short of breath, no night sweats. Patient has tried over the counter sleep aid (she is unable to recall the name of the agent) and benadryl in the past with little effect. She has never been prescribed a sleeping aid medication in the past.    PMH: Past Medical History:  Diagnosis Date  . Anxiety and depression   . Eustachian tube dysfunction, right 10/2017   Persistent (6 wks)-->failed nasal steroid, decongestant, and systemic steroid trial.  ENT to do tympanostomy tube as of 11/10/17.  Marland Kitchen Hepatic hemangioma    small, right side: seen on u/s 2013, no change on CT 2018.  Marland Kitchen Hypercholesterolemia 05/2019    Mild->TLC  . Obesity, Class II, BMI 35-39.9   . Palpitations 10/2017   48 H Holter with some PAC's and PVCs, brief run of atrial ectopy.  No afib.  . Subclinical hypothyroidism 05/2019   TSH around 5. Plan rechk labs 6 mo.    M/A: Current Outpatient Medications on File Prior to Visit  Medication Sig Dispense Refill  . sertraline (ZOLOFT) 50 MG tablet 1 tab po qd x 10d, then increase to 2 tabs po qd 50 tablet 0   No current facility-administered medications on file prior to visit.   Allergies  Allergen Reactions  . Penicillins Hives    FH: Family History  Problem Relation Age of Onset  . Thyroid disease Mother   . Hypertension Mother   . Diabetes Mother   . Heart disease Mother   . Hyperlipidemia Mother   . Breast cancer Mother   . Hypertension Father   . Hyperlipidemia Father   . Multiple myeloma Father   . Colon cancer Paternal Grandfather   . Kidney disease Neg Hx   . Asthma Neg Hx   . Stroke Neg Hx   . Esophageal cancer Neg Hx   . Rectal cancer Neg Hx   . Stomach cancer Neg Hx     SH: Social History   Socioeconomic History  . Marital status: Married    Spouse name: Not on file  . Number of children: 3  . Years of education: Not on file  . Highest education level: Not on file  Occupational History  . Occupation: Best boy: [employer]    Employer: [employer]  Tobacco Use  . Smoking status: Former Smoker    Packs/day: 1.00    Years: 20.00    Pack years: 20.00    Types: Cigarettes    Quit date: 06/10/2011    Years since quitting: 9.0  . Smokeless tobacco: Never Used  Vaping Use  . Vaping Use: Never used  Substance and Sexual Activity  . Alcohol use: No    Comment: seldom; once a month  . Drug use: No  . Sexual activity: Not on file  Other Topics Concern  . Not on file  Social History Narrative   Married, 3 children.  Lives in [place].   Educ: HS   Occupation: Environmental health practitioner and print room for [employer]   Tob: 20 pack-yr hx, quit  2013.   Alc: rare.   No drugs.      Social Determinants of Health   Financial Resource Strain: Not on file  Food Insecurity: Not on file  Transportation Needs: Not on file  Physical Activity: Not on file  Stress: Not on file  Social Connections: Not on file    ROS: Review of Systems  Constitutional: Negative for diaphoresis, fever and weight loss.  Gastrointestinal: Negative for nausea and vomiting.  Neurological: Negative for dizziness.  Psychiatric/Behavioral: Negative for depression and suicidal ideas. The patient is not nervous/anxious.     PE: Vitals with BMI 05/16/2020 05/09/2019 10/09/2017  Height _0  _1  _2   Weight 213 lbs 13 oz 219 lbs 10 oz 201 lbs 8 oz  BMI 39.09 75.79 72.8  Systolic 206 015 615  Diastolic 77 81 72  Pulse 70 76 68    Physical Exam Constitutional:      Appearance: Normal appearance.  HENT:     Head: Normocephalic and atraumatic.  Cardiovascular:     Rate and Rhythm: Normal rate and regular rhythm.  Pulmonary:     Effort: Pulmonary effort is normal.     Breath sounds: Normal breath sounds.  Neurological:     Mental Status: She is alert.  Psychiatric:        Mood and Affect: Mood normal. Mood is not anxious or depressed.        Behavior: Behavior normal.     Labs: Recent Results (from the past 2160 hour(s))  CBC with Differential/Platelet     Status: Abnormal   Collection Time: 05/16/20  9:31 AM  Result Value Ref Range   WBC 7.0 4.0 - 10.5 K/uL   RBC 4.35 3.87 - 5.11 Mil/uL   Hemoglobin 13.0 12.0 - 15.0 g/dL   HCT 39.4 36.0 - 46.0 %   MCV 90.6 78.0 - 100.0 fl   MCHC 33.1 30.0 - 36.0 g/dL   RDW 16.2 (H) 11.5 - 15.5 %   Platelets 411.0 (H) 150.0 - 400.0 K/uL   Neutrophils Relative % 61.9 43.0 - 77.0 %   Lymphocytes Relative 28.1 12.0 - 46.0 %   Monocytes Relative 8.1 3.0 - 12.0 %   Eosinophils Relative 0.9 0.0 - 5.0 %   Basophils Relative 1.0 0.0 - 3.0 %   Neutro Abs 4.3 1.4 - 7.7 K/uL   Lymphs Abs 2.0 0.7 - 4.0 K/uL    Monocytes Absolute 0.6 0.1 - 1.0 K/uL   Eosinophils Absolute 0.1 0.0 - 0.7 K/uL   Basophils Absolute 0.1 0.0 - 0.1 K/uL  TSH     Status: None  Collection Time: 05/16/20  9:31 AM  Result Value Ref Range   TSH 2.92 0.35 - 4.50 uIU/mL  Comprehensive metabolic panel     Status: None   Collection Time: 05/16/20  9:31 AM  Result Value Ref Range   Sodium 138 135 - 145 mEq/L   Potassium 4.4 3.5 - 5.1 mEq/L   Chloride 103 96 - 112 mEq/L   CO2 27 19 - 32 mEq/L   Glucose, Bld 95 70 - 99 mg/dL   BUN 14 6 - 23 mg/dL   Creatinine, Ser 0.96 0.40 - 1.20 mg/dL   Total Bilirubin 0.6 0.2 - 1.2 mg/dL   Alkaline Phosphatase 60 39 - 117 U/L   AST 16 0 - 37 U/L   ALT 14 0 - 35 U/L   Total Protein 7.3 6.0 - 8.3 g/dL   Albumin 4.3 3.5 - 5.2 g/dL   GFR 69.17 >60.00 mL/min    Comment: Calculated using the CKD-EPI Creatinine Equation (2021)   Calcium 9.3 8.4 - 10.5 mg/dL  T3     Status: None   Collection Time: 05/16/20  9:31 AM  Result Value Ref Range   T3, Total 118 76 - 181 ng/dL  T4, free     Status: None   Collection Time: 05/16/20  9:31 AM  Result Value Ref Range   Free T4 0.77 0.60 - 1.60 ng/dL    Comment: Specimens from patients who are undergoing biotin therapy and /or ingesting biotin supplements may contain high levels of biotin.  The higher biotin concentration in these specimens interferes with this Free T4 assay.  Specimens that contain high levels  of biotin may cause false high results for this Free T4 assay.  Please interpret results in light of the total clinical presentation of the patient.       A/P: In summary, DEMIYAH FISCHBACH is a 50 y.o. year old female who presents to the clinic today for 4-week follow up for generalized anxiety, panic attacks, and sleep disturbance. Physical exam unremarkable.  1) Generalized anxiety: Patient doing well with sertraline 100 mg qhs. Reports marked improvement, "90% better," in anxiety levels since starting sertraline 4 weeks ago. Patient denies  any side effects, including suicidal ideation. -Continue Sertraline 100 mg qhs.  2) Sleep Disturbance: Seems to be a chronic problem. OTC sleep aids with no effect on sleep. Discussed with patient sleep hygiene techniques that may improve quality of sleep. Will do trial of 1/2 tablet of temazepam 15 mg PO PRN for inability to sleep. Informed patient that it best that she not use this medication every night, but when she feels as though she in unable to sleep. Informed patient of side effects, including increased drowsiness and sedation. Will reassess in 1 month. - 1/2 tablet temazepam 15 mg PO PRN for inability to sleep.   Lab Orders  No laboratory test(s) ordered today     Follow Up:  1 month for sleep disturbance Signed: Nanetta Batty, MS3  I personally was present during the history, physical exam, and medical decision-making activities of this service and have verified that the service and findings are accurately documented in the student's note. Signed:  Crissie Sickles, MD           06/14/2020

## 2020-06-15 ENCOUNTER — Encounter: Payer: Self-pay | Admitting: Internal Medicine

## 2020-07-13 ENCOUNTER — Ambulatory Visit (INDEPENDENT_AMBULATORY_CARE_PROVIDER_SITE_OTHER): Payer: BC Managed Care – PPO | Admitting: Family Medicine

## 2020-07-13 ENCOUNTER — Other Ambulatory Visit: Payer: Self-pay

## 2020-07-13 ENCOUNTER — Encounter: Payer: Self-pay | Admitting: Family Medicine

## 2020-07-13 VITALS — BP 132/79 | HR 68 | Temp 97.6°F | Resp 16 | Ht 62.0 in | Wt 213.6 lb

## 2020-07-13 DIAGNOSIS — G4700 Insomnia, unspecified: Secondary | ICD-10-CM | POA: Diagnosis not present

## 2020-07-13 DIAGNOSIS — F411 Generalized anxiety disorder: Secondary | ICD-10-CM | POA: Diagnosis not present

## 2020-07-13 MED ORDER — TEMAZEPAM 15 MG PO CAPS
ORAL_CAPSULE | ORAL | 1 refills | Status: DC
Start: 2020-07-13 — End: 2021-01-09

## 2020-07-13 NOTE — Progress Notes (Signed)
OFFICE VISIT  07/13/2020  CC:  Chief Complaint  Patient presents with  . Follow-up    Insomnia, medication is working well.    HPI:    Patient is a 50 y.o. Caucasian female who presents for 1 mo f/u insomnia. A/P as of last visit: "1) Generalized anxiety: Patient doing well with sertraline 100 mg qhs. Reports marked improvement, "90% better," in anxiety levels since starting sertraline 4 weeks ago. Patient denies any side effects, including suicidal ideation. -Continue Sertraline 100 mg qhs.  2) Sleep Disturbance: Seems to be a chronic problem. OTC sleep aids with no effect on sleep. Discussed with patient sleep hygiene techniques that may improve quality of sleep. Will do trial of 1/2 tablet of temazepam 15 mg PO PRN for inability to sleep. Informed patient that it best that she not use this medication every night, but when she feels as though she in unable to sleep. Informed patient of side effects, including increased drowsiness and sedation. Will reassess in 1 month. - 1/2 tablet temazepam 15 mg PO PRN for inability to sleep."  INTERIM HX: Finds 15mg  temazepam hs is very helpful and causes no hangover effect.  She may occ still wake up in night but goes back to sleep w/out problem. Mood and anxiety levels still stable: "I feel like a different person"!!   PMP AWARE reviewed today: most recent rx for temazepam 15mg  was filled 06/14/20, # 30, rx by me. No red flags.   Past Medical History:  Diagnosis Date  . Anxiety and depression   . Eustachian tube dysfunction, right 10/2017   Persistent (6 wks)-->failed nasal steroid, decongestant, and systemic steroid trial.  ENT to do tympanostomy tube as of 11/10/17.  Marland Kitchen Hepatic hemangioma    small, right side: seen on u/s 2013, no change on CT 2018.  Marland Kitchen Hypercholesterolemia 05/2019   Mild->TLC  . Obesity, Class II, BMI 35-39.9   . Palpitations 10/2017   48 H Holter with some PAC's and PVCs, brief run of atrial ectopy.  No afib.  .  Subclinical hypothyroidism 05/2019   TSH around 5. Plan rechk labs 6 mo.    Past Surgical History:  Procedure Laterality Date  . 48 HR HOLTER  10/2017   48 H Holter with some PAC's and PVCs, brief run of atrial ectopy.  No afib.  . BUNIONECTOMY     right foot  . CESAREAN SECTION  x 2   1995; 2000  . TONSILLECTOMY  1973   as a child  . TUBAL LIGATION  2001    Outpatient Medications Prior to Visit  Medication Sig Dispense Refill  . sertraline (ZOLOFT) 100 MG tablet Take 1 tablet (100 mg total) by mouth daily. 90 tablet 3  . temazepam (RESTORIL) 15 MG capsule 1/2 to 1 tab po qhs as needed for sleep 30 capsule 0   No facility-administered medications prior to visit.    Allergies  Allergen Reactions  . Penicillins Hives    ROS As per HPI  PE: Vitals with BMI 07/13/2020 06/14/2020 05/16/2020  Height 5\' 2"  5\' 2"  5\' 2"   Weight 213 lbs 10 oz 215 lbs 10 oz 213 lbs 13 oz  BMI 39.06 72.53 66.44  Systolic 034 742 595  Diastolic 79 84 77  Pulse 68 80 70     Gen: Alert, well appearing.  Patient is oriented to person, place, time, and situation. AFFECT: pleasant, lucid thought and speech. No further exam today.  LABS:    Chemistry  Component Value Date/Time   NA 138 05/16/2020 0931   K 4.4 05/16/2020 0931   CL 103 05/16/2020 0931   CO2 27 05/16/2020 0931   BUN 14 05/16/2020 0931   CREATININE 0.96 05/16/2020 0931   CREATININE 0.92 07/14/2016 1618      Component Value Date/Time   CALCIUM 9.3 05/16/2020 0931   ALKPHOS 60 05/16/2020 0931   AST 16 05/16/2020 0931   ALT 14 05/16/2020 0931   BILITOT 0.6 05/16/2020 0931     IMPRESSION AND PLAN:  1) Insomnia; doing very well with 15mg  temazepam qhs. Continue this. Controlled substance contract reviewed with patient today.  Patient signed this and it will be placed in the chart.   Today I eRx'd Temazepam 15mg  caps, 1 qhs prn, #90, RF x 1.  2) GAD: she has had excellent response to sertraline 100mg  qd. Continue  this.  An After Visit Summary was printed and given to the patient.  FOLLOW UP: Return in about 6 months (around 01/10/2021) for f/u anxiety and insomnia.  Signed:  Crissie Sickles, MD           07/13/2020

## 2020-07-20 ENCOUNTER — Other Ambulatory Visit: Payer: Self-pay

## 2020-07-20 ENCOUNTER — Ambulatory Visit (INDEPENDENT_AMBULATORY_CARE_PROVIDER_SITE_OTHER): Payer: BC Managed Care – PPO

## 2020-07-20 DIAGNOSIS — Z23 Encounter for immunization: Secondary | ICD-10-CM

## 2020-08-17 ENCOUNTER — Ambulatory Visit (AMBULATORY_SURGERY_CENTER): Payer: Self-pay | Admitting: *Deleted

## 2020-08-17 ENCOUNTER — Other Ambulatory Visit: Payer: Self-pay

## 2020-08-17 VITALS — Ht 62.0 in | Wt 213.0 lb

## 2020-08-17 DIAGNOSIS — Z1211 Encounter for screening for malignant neoplasm of colon: Secondary | ICD-10-CM

## 2020-08-17 MED ORDER — PEG-KCL-NACL-NASULF-NA ASC-C 100 G PO SOLR: 1.0000 | Freq: Once | ORAL | 0 refills | Status: AC

## 2020-08-17 NOTE — Progress Notes (Signed)
Patient is here in-person for PV. Patient denies any allergies to eggs or soy. Patient denies any problems with anesthesia/sedation. Patient denies any oxygen use at home. Patient denies taking any diet/weight loss medications or blood thinners. Patient is not being treated for MRSA or C-diff. Patient is aware of our care-partner policy and ZHGDJ-24 safety protocol. EMMI education assigned to the patient for the procedure, sent to [facility].   Patient is fully COVID-19 vaccinated, per patient. Pt aware to let us know if any medical hx changes occur before colon date.

## 2020-08-23 LAB — HM MAMMOGRAPHY

## 2020-08-27 ENCOUNTER — Other Ambulatory Visit: Payer: Self-pay | Admitting: Obstetrics & Gynecology

## 2020-08-27 DIAGNOSIS — R928 Other abnormal and inconclusive findings on diagnostic imaging of breast: Secondary | ICD-10-CM

## 2020-08-27 LAB — HM PAP SMEAR: HM Pap smear: NORMAL

## 2020-08-27 LAB — RESULTS CONSOLE HPV: CHL HPV: NEGATIVE

## 2020-08-31 ENCOUNTER — Encounter: Payer: BC Managed Care – PPO | Admitting: Internal Medicine

## 2020-09-07 DIAGNOSIS — R928 Other abnormal and inconclusive findings on diagnostic imaging of breast: Secondary | ICD-10-CM

## 2020-09-07 HISTORY — DX: Other abnormal and inconclusive findings on diagnostic imaging of breast: R92.8

## 2020-09-12 ENCOUNTER — Ambulatory Visit
Admission: RE | Admit: 2020-09-12 | Discharge: 2020-09-12 | Disposition: A | Discharge status: Home / Self Care | Payer: BC Managed Care – PPO | Source: Ambulatory Visit | Attending: Obstetrics & Gynecology | Admitting: Obstetrics & Gynecology

## 2020-09-12 ENCOUNTER — Other Ambulatory Visit: Payer: Self-pay | Admitting: Obstetrics & Gynecology

## 2020-09-12 ENCOUNTER — Other Ambulatory Visit: Payer: Self-pay

## 2020-09-12 DIAGNOSIS — R928 Other abnormal and inconclusive findings on diagnostic imaging of breast: Secondary | ICD-10-CM

## 2020-09-12 LAB — HM MAMMOGRAPHY

## 2020-09-19 ENCOUNTER — Encounter: Payer: Self-pay | Admitting: Family Medicine

## 2020-09-24 ENCOUNTER — Ambulatory Visit
Admission: RE | Admit: 2020-09-24 | Discharge: 2020-09-24 | Disposition: A | Discharge status: Home / Self Care | Payer: BC Managed Care – PPO | Source: Ambulatory Visit | Attending: Obstetrics & Gynecology | Admitting: Obstetrics & Gynecology

## 2020-09-24 ENCOUNTER — Other Ambulatory Visit: Payer: Self-pay

## 2020-09-24 ENCOUNTER — Other Ambulatory Visit: Payer: Self-pay | Admitting: Obstetrics & Gynecology

## 2020-09-24 DIAGNOSIS — R928 Other abnormal and inconclusive findings on diagnostic imaging of breast: Secondary | ICD-10-CM

## 2020-09-24 HISTORY — PX: BREAST BIOPSY: SHX20

## 2020-09-24 LAB — HM MAMMOGRAPHY

## 2020-10-01 ENCOUNTER — Encounter: Payer: Self-pay | Admitting: Family Medicine

## 2020-10-03 ENCOUNTER — Telehealth: Payer: Self-pay | Admitting: Internal Medicine

## 2020-10-03 DIAGNOSIS — Z1211 Encounter for screening for malignant neoplasm of colon: Secondary | ICD-10-CM

## 2020-10-03 MED ORDER — PEG 3350-KCL-NA BICARB-NACL 420 G PO SOLR: 4000.0000 mL | Freq: Once | ORAL | 0 refills | Status: AC

## 2020-10-03 NOTE — Telephone Encounter (Signed)
Inbound call from patient stating insurance will not cover Moviprep but they will cover PEG and is asking for it to be sent to the pharmacy please.

## 2020-10-03 NOTE — Telephone Encounter (Signed)
Sent in new prescription for Golytely, instructions sent through [facility].

## 2020-10-15 ENCOUNTER — Other Ambulatory Visit: Payer: Self-pay

## 2020-10-15 ENCOUNTER — Other Ambulatory Visit: Payer: Self-pay | Admitting: Internal Medicine

## 2020-10-15 ENCOUNTER — Encounter: Payer: Self-pay | Admitting: Internal Medicine

## 2020-10-15 ENCOUNTER — Ambulatory Visit (AMBULATORY_SURGERY_CENTER): Payer: BC Managed Care – PPO | Admitting: Internal Medicine

## 2020-10-15 VITALS — BP 117/54 | HR 51 | Temp 98.2°F | Resp 19 | Ht 62.0 in | Wt 213.0 lb

## 2020-10-15 DIAGNOSIS — D124 Benign neoplasm of descending colon: Secondary | ICD-10-CM

## 2020-10-15 DIAGNOSIS — D122 Benign neoplasm of ascending colon: Secondary | ICD-10-CM

## 2020-10-15 DIAGNOSIS — K635 Polyp of colon: Secondary | ICD-10-CM

## 2020-10-15 DIAGNOSIS — Z1211 Encounter for screening for malignant neoplasm of colon: Secondary | ICD-10-CM

## 2020-10-15 DIAGNOSIS — Z86010 Personal history of colonic polyps: Secondary | ICD-10-CM

## 2020-10-15 DIAGNOSIS — D128 Benign neoplasm of rectum: Secondary | ICD-10-CM | POA: Diagnosis not present

## 2020-10-15 DIAGNOSIS — Z860101 Personal history of adenomatous and serrated colon polyps: Secondary | ICD-10-CM

## 2020-10-15 DIAGNOSIS — D123 Benign neoplasm of transverse colon: Secondary | ICD-10-CM

## 2020-10-15 DIAGNOSIS — D120 Benign neoplasm of cecum: Secondary | ICD-10-CM

## 2020-10-15 HISTORY — DX: Personal history of adenomatous and serrated colon polyps: Z86.0101

## 2020-10-15 HISTORY — PX: COLONOSCOPY W/ POLYPECTOMY: SHX1380

## 2020-10-15 HISTORY — DX: Personal history of colonic polyps: Z86.010

## 2020-10-15 MED ORDER — SODIUM CHLORIDE 0.9 % IV SOLN: 500.0000 mL | Freq: Once | INTRAVENOUS | Status: DC

## 2020-10-15 NOTE — Progress Notes (Signed)
Pt's states no medical or surgical changes since previsit or office visit. 

## 2020-10-15 NOTE — Op Note (Signed)
[facility] Patient Name: Gabriella Stephens Procedure Date: 10/15/2020 7:59 AM MRN: [id number] Endoscopist: Jerene Bears , MD Age: 50 Referring MD:  Date of Birth: 12/01/1970 Gender: Female Account #: [id number] Procedure:                Colonoscopy Indications:              Screening for colorectal malignant neoplasm, This                            is the patient's first colonoscopy Medicines:                Monitored Anesthesia Care Procedure:                Pre-Anesthesia Assessment:                           - Prior to the procedure, a History and Physical                            was performed, and patient medications and                            allergies were reviewed. The patient's tolerance of                            previous anesthesia was also reviewed. The risks                            and benefits of the procedure and the sedation                            options and risks were discussed with the patient.                            All questions were answered, and informed consent                            was obtained. Prior Anticoagulants: The patient has                            taken no previous anticoagulant or antiplatelet                            agents. ASA Grade Assessment: II - A patient with                            mild systemic disease. After reviewing the risks                            and benefits, the patient was deemed in                            satisfactory condition to undergo the procedure.  After obtaining informed consent, the colonoscope                            was passed under direct vision. Throughout the                            procedure, the patient's blood pressure, pulse, and                            oxygen saturations were monitored continuously. The                            Olympus PCF-H190DL (#[id number]) Colonoscope was                            introduced through the anus  and advanced to the                            terminal ileum. The colonoscopy was performed                            without difficulty. The patient tolerated the                            procedure well. The quality of the bowel                            preparation was good. The terminal ileum, ileocecal                            valve, appendiceal orifice, and rectum were                            photographed. Scope In: 8:06:19 AM Scope Out: 8:39:24 AM Scope Withdrawal Time: 0 hours 27 minutes 39 seconds  Total Procedure Duration: 0 hours 33 minutes 5 seconds  Findings:                 The digital rectal exam was normal.                           The terminal ileum appeared normal.                           A 25 mm polyp was found in the cecum. The polyp was                            sessile. The polyp was removed with a piecemeal                            technique using a cold snare. Resection and                            retrieval were complete.  A 6 mm polyp was found in the ascending colon. The                            polyp was sessile. The polyp was removed with a                            cold snare. Resection and retrieval were complete.                           A 3 mm polyp was found in the transverse colon. The                            polyp was sessile. The polyp was removed with a                            cold snare. Resection and retrieval were complete.                           Two sessile polyps were found in the descending                            colon. The polyps were 4 to 6 mm in size. These                            polyps were removed with a cold snare. Resection                            and retrieval were complete.                           A 2 mm polyp was found in the rectum. The polyp was                            sessile. The polyp was removed with a cold biopsy                            forceps. Resection  and retrieval were complete.                           A few small-mouthed diverticula were found in the                            sigmoid colon and descending colon.                           Internal hemorrhoids were found during                            retroflexion. The hemorrhoids were small. Complications:            No immediate complications. Estimated Blood Loss:     Estimated blood loss was minimal. Impression:               -  The examined portion of the ileum was normal.                           - One 25 mm polyp in the cecum, removed piecemeal                            using a cold snare. Resected and retrieved.                           - One 6 mm polyp in the ascending colon, removed                            with a cold snare. Resected and retrieved.                           - One 3 mm polyp in the transverse colon, removed                            with a cold snare. Resected and retrieved.                           - Two 4 to 6 mm polyps in the descending colon,                            removed with a cold snare. Resected and retrieved.                           - One 2 mm polyp in the rectum, removed with a cold                            biopsy forceps. Resected and retrieved.                           - Diverticulosis in the sigmoid colon and in the                            descending colon.                           - Small internal hemorrhoids. Recommendation:           - Patient has a contact number available for                            emergencies. The signs and symptoms of potential                            delayed complications were discussed with the                            patient. Return to normal activities tomorrow.  Written discharge instructions were provided to the                            patient.                           - Resume previous diet.                           - Continue present medications.                            - Await pathology results.                           - Repeat colonoscopy is recommended for                            surveillance in about 6 months. The colonoscopy                            date will be determined after pathology results                            from today's exam become available for review. Jerene Bears, MD 10/15/2020 8:47:06 AM This report has been signed electronically.

## 2020-10-15 NOTE — Patient Instructions (Signed)
Handout on polyps, diverticulosis and hemorrhoids given. ? ?YOU HAD AN ENDOSCOPIC PROCEDURE TODAY AT THE [facility]:   Refer to the procedure report that was given to you for any specific questions about what was found during the examination.  If the procedure report does not answer your questions, please call your gastroenterologist to clarify.  If you requested that your care partner not be given the details of your procedure findings, then the procedure report has been included in a sealed envelope for you to review at your convenience later. ? ?YOU SHOULD EXPECT: Some feelings of bloating in the abdomen. Passage of more gas than usual.  Walking can help get rid of the air that was put into your GI tract during the procedure and reduce the bloating. If you had a lower endoscopy (such as a colonoscopy or flexible sigmoidoscopy) you may notice spotting of blood in your stool or on the toilet paper. If you underwent a bowel prep for your procedure, you may not have a normal bowel movement for a few days. ? ?Please Note:  You might notice some irritation and congestion in your nose or some drainage.  This is from the oxygen used during your procedure.  There is no need for concern and it should clear up in a day or so. ? ?SYMPTOMS TO REPORT IMMEDIATELY: ? ?Following lower endoscopy (colonoscopy or flexible sigmoidoscopy): ? Excessive amounts of blood in the stool ? Significant tenderness or worsening of abdominal pains ? Swelling of the abdomen that is new, acute ? Fever of 100?F or higher ? ? ?For urgent or emergent issues, a gastroenterologist can be reached at any hour by calling [phone]. ?Do not use MyChart messaging for urgent concerns.  ? ? ?DIET:  We do recommend a small meal at first, but then you may proceed to your regular diet.  Drink plenty of fluids but you should avoid alcoholic beverages for 24 hours. ? ?ACTIVITY:  You should plan to take it easy for the rest of today and you  should NOT DRIVE or use heavy machinery until tomorrow (because of the sedation medicines used during the test).   ? ?FOLLOW UP: ?Our staff will call the number listed on your records 48-72 hours following your procedure to check on you and address any questions or concerns that you may have regarding the information given to you following your procedure. If we do not reach you, we will leave a message.  We will attempt to reach you two times.  During this call, we will ask if you have developed any symptoms of COVID 19. If you develop any symptoms (ie: fever, flu-like symptoms, shortness of breath, cough etc.) before then, please call [phone].  If you test positive for Covid 19 in the 2 weeks post procedure, please call and report this information to us.   ? ?If any biopsies were taken you will be contacted by phone or by letter within the next 1-3 weeks.  Please call us at [phone] if you have not heard about the biopsies in 3 weeks.  ? ? ?SIGNATURES/CONFIDENTIALITY: ?You and/or your care partner have signed paperwork which will be entered into your electronic medical record.  These signatures attest to the fact that that the information above on your After Visit Summary has been reviewed and is understood.  Full responsibility of the confidentiality of this discharge information lies with you and/or your care-partner.  ?

## 2020-10-15 NOTE — Progress Notes (Signed)
Report given to PACU, vss 

## 2020-10-15 NOTE — Progress Notes (Signed)
C.W. vital signs. 

## 2020-10-15 NOTE — Progress Notes (Signed)
Called to room to assist during endoscopic procedure.  Patient ID and intended procedure confirmed with present staff. Received instructions for my participation in the procedure from the performing physician.  

## 2020-10-17 ENCOUNTER — Telehealth: Payer: Self-pay

## 2020-10-17 NOTE — Telephone Encounter (Signed)
  Follow up Call-  Call back number 10/15/2020  Post procedure Call Back phone  # [phone]  Permission to leave phone message Yes  Some recent data might be hidden     Patient questions:  Do you have a fever, pain , or abdominal swelling? No. Pain Score  0 *  Have you tolerated food without any problems? Yes.    Have you been able to return to your normal activities? Yes.    Do you have any questions about your discharge instructions: Diet   No. Medications  No. Follow up visit  No.  Do you have questions or concerns about your Care? No.  Actions: * If pain score is 4 or above: No action needed, pain <4. 1. Have you developed a fever since your procedure? no  2.   Have you had an respiratory symptoms (SOB or cough) since your procedure? no  3.   Have you tested positive for COVID 19 since your procedure no  4.   Have you had any family members/close contacts diagnosed with the COVID 19 since your procedure?  no   If yes to any of these questions please route to Joylene John, RN and Joella Prince, RN

## 2020-10-25 ENCOUNTER — Encounter: Payer: Self-pay | Admitting: Internal Medicine

## 2021-01-10 ENCOUNTER — Encounter: Payer: Self-pay | Admitting: Family Medicine

## 2021-01-10 ENCOUNTER — Ambulatory Visit (INDEPENDENT_AMBULATORY_CARE_PROVIDER_SITE_OTHER): Payer: BC Managed Care – PPO | Admitting: Family Medicine

## 2021-01-10 ENCOUNTER — Other Ambulatory Visit: Payer: Self-pay

## 2021-01-10 VITALS — BP 109/73 | HR 66 | Temp 97.7°F | Resp 16 | Ht 62.0 in | Wt 209.4 lb

## 2021-01-10 DIAGNOSIS — Z79899 Other long term (current) drug therapy: Secondary | ICD-10-CM

## 2021-01-10 DIAGNOSIS — G4700 Insomnia, unspecified: Secondary | ICD-10-CM

## 2021-01-10 DIAGNOSIS — F411 Generalized anxiety disorder: Secondary | ICD-10-CM | POA: Diagnosis not present

## 2021-01-10 MED ORDER — TEMAZEPAM 15 MG PO CAPS: ORAL_CAPSULE | ORAL | 1 refills | Status: DC

## 2021-01-10 NOTE — Progress Notes (Signed)
OFFICE VISIT  01/10/2021  CC:  Chief Complaint  Patient presents with   Follow-up    Anxiety, insomnia   HPI:    Patient is a 50 y.o. Caucasian female who presents for 6 mo f/u GAD and insomnia. A/P as of last visit: "1) Insomnia; doing very well with '15mg'$  temazepam qhs. Continue this. Controlled substance contract reviewed with patient today.  Patient signed this and it will be placed in the chart.   Today I eRx'd Temazepam '15mg'$  caps, 1 qhs prn, #90, RF x 1.   2) GAD: she has had excellent response to sertraline '100mg'$  qd. Continue this."  INTERIM HX: Feeling well.  Anxiety and mood are good/level.  Taking sertraline 100 qd. Very busy at work.  Frustrated with trying to get people to work--no one even applies for her open positions!   Sleep: doing well, gets 6-7 hrs/night as long as she takes restoril.  No hangover effect. PMP AWARE reviewed today: most recent rx for temazepam 15 was filled 10/18/20, # 40, rx by me. No red flags.   Past Medical History:  Diagnosis Date   Abnormal mammogram 09/2020   L breast, ? complicated cysts?-->bx [id number] showed fibroadenomas x 2-->resume annual mammogram screening.   Anxiety and depression    Eustachian tube dysfunction, right 10/2017   Persistent (6 wks)-->failed nasal steroid, decongestant, and systemic steroid trial.  ENT to do tympanostomy tube as of 11/10/17.   Hepatic hemangioma    small, right side: seen on u/s 2013, no change on CT 2018.   History of adenomatous polyp of colon 10/15/2020   many->rpt 6 mo recommended   Hypercholesterolemia 05/2019   Mild->TLC   Obesity, Class II, BMI 35-39.9    Palpitations 10/2017   48 H Holter with some PAC's and PVCs, brief run of atrial ectopy.  No afib.   Subclinical hypothyroidism 05/2019   TSH around 5. Plan rechk labs 6 mo.    Past Surgical History:  Procedure Laterality Date   48 HR HOLTER  10/2017   48 H Holter with some PAC's and PVCs, brief run of atrial ectopy.  No afib.    BREAST BIOPSY Left 09/24/2020   Fibroadenoma   BUNIONECTOMY     right foot   CESAREAN SECTION  x 2   1995; 2000   COLONOSCOPY W/ POLYPECTOMY  10/15/2020   +adenomas->rpt 6 mo recommended   TONSILLECTOMY  1973   as a child   TUBAL LIGATION  2001   UPPER GASTROINTESTINAL ENDOSCOPY  2013    Outpatient Medications Prior to Visit  Medication Sig Dispense Refill   sertraline (ZOLOFT) 100 MG tablet Take 1 tablet (100 mg total) by mouth daily. 90 tablet 3   temazepam (RESTORIL) 15 MG capsule 1 tab po qhs as needed for sleep 90 capsule 1   No facility-administered medications prior to visit.    Allergies  Allergen Reactions   Penicillins Hives    ROS As per HPI  PE: Vitals with BMI 01/10/2021 10/15/2020 10/15/2020  Height '5\' 2"'$  - -  Weight 209 lbs 6 oz - -  BMI [id number] - -  Systolic 0000000 [id number] 0000000  Diastolic 73 54 68  Pulse 66 51 56     Gen: Alert, well appearing.  Patient is oriented to person, place, time, and situation. AFFECT: pleasant, lucid thought and speech. CV: RRR, no m/r/g.   LUNGS: CTA bilat, nonlabored resps, good aeration in all lung fields. EXT: no clubbing or cyanosis.  no edema.  LABS:  Lab Results  Component Value Date   TSH 2.92 05/16/2020   Lab Results  Component Value Date   WBC 7.0 05/16/2020   HGB 13.0 05/16/2020   HCT 39.4 05/16/2020   MCV 90.6 05/16/2020   PLT 411.0 (H) 05/16/2020   Lab Results  Component Value Date   CREATININE 0.96 05/16/2020   BUN 14 05/16/2020   NA 138 05/16/2020   K 4.4 05/16/2020   CL 103 05/16/2020   CO2 27 05/16/2020   Lab Results  Component Value Date   ALT 14 05/16/2020   AST 16 05/16/2020   ALKPHOS 60 05/16/2020   BILITOT 0.6 05/16/2020   Lab Results  Component Value Date   CHOL 229 (H) 05/09/2019   Lab Results  Component Value Date   HDL 56.60 05/09/2019   Lab Results  Component Value Date   LDLCALC 152 (H) 05/09/2019   Lab Results  Component Value Date   TRIG 102.0 05/09/2019   Lab  Results  Component Value Date   CHOLHDL 4 05/09/2019    IMPRESSION AND PLAN:  1) GAD: doing well on sertraline '100mg'$ .    2) Insomnia: doing well on restoril '15mg'$  qhs. CSC UTD. UDS today. RF'd restoril '15mg'$ , 1 qhs, #90, RF x 1.  An After Visit Summary was printed and given to the patient.  FOLLOW UP: Return in about 6 months (around 07/13/2021) for annual CPE (fasting).  Signed:  Crissie Sickles, MD           01/10/2021

## 2021-01-14 LAB — DRUG MONITORING PANEL 376104, URINE
Alphahydroxyalprazolam: NEGATIVE ng/mL (ref <25)
Alphahydroxymidazolam: NEGATIVE ng/mL (ref <50)
Alphahydroxytriazolam: NEGATIVE ng/mL (ref <50)
Aminoclonazepam: NEGATIVE ng/mL (ref <25)
Amphetamines: NEGATIVE ng/mL (ref <500)
Barbiturates: NEGATIVE ng/mL (ref <300)
Benzodiazepines: POSITIVE ng/mL — AB (ref <100)
Cocaine Metabolite: NEGATIVE ng/mL (ref <150)
Desmethyltramadol: NEGATIVE ng/mL (ref <100)
Hydroxyethylflurazepam: NEGATIVE ng/mL (ref <50)
Lorazepam: NEGATIVE ng/mL (ref <50)
Nordiazepam: NEGATIVE ng/mL (ref <50)
Opiates: NEGATIVE ng/mL (ref <100)
Oxazepam: 568 ng/mL — ABNORMAL HIGH (ref <50)
Oxycodone: NEGATIVE ng/mL (ref <100)
Temazepam: 4822 ng/mL — ABNORMAL HIGH (ref <50)
Tramadol: NEGATIVE ng/mL (ref <100)

## 2021-01-14 LAB — DM TEMPLATE

## 2021-01-22 ENCOUNTER — Other Ambulatory Visit: Payer: Self-pay | Admitting: Family Medicine

## 2021-05-26 ENCOUNTER — Encounter: Payer: Self-pay | Admitting: Internal Medicine

## 2021-06-06 ENCOUNTER — Other Ambulatory Visit: Payer: Self-pay | Admitting: Family Medicine

## 2021-07-10 DIAGNOSIS — E538 Deficiency of other specified B group vitamins: Secondary | ICD-10-CM

## 2021-07-10 DIAGNOSIS — D509 Iron deficiency anemia, unspecified: Secondary | ICD-10-CM

## 2021-07-10 HISTORY — DX: Deficiency of other specified B group vitamins: E53.8

## 2021-07-10 HISTORY — DX: Iron deficiency anemia, unspecified: D50.9

## 2021-07-30 ENCOUNTER — Ambulatory Visit (INDEPENDENT_AMBULATORY_CARE_PROVIDER_SITE_OTHER): Payer: BC Managed Care – PPO | Admitting: Family Medicine

## 2021-07-30 ENCOUNTER — Other Ambulatory Visit: Payer: Self-pay

## 2021-07-30 ENCOUNTER — Encounter: Payer: Self-pay | Admitting: Family Medicine

## 2021-07-30 VITALS — BP 122/76 | HR 61 | Temp 97.8°F | Ht 64.0 in | Wt 209.0 lb

## 2021-07-30 DIAGNOSIS — Z131 Encounter for screening for diabetes mellitus: Secondary | ICD-10-CM | POA: Diagnosis not present

## 2021-07-30 DIAGNOSIS — Z0000 Encounter for general adult medical examination without abnormal findings: Secondary | ICD-10-CM

## 2021-07-30 DIAGNOSIS — Z23 Encounter for immunization: Secondary | ICD-10-CM | POA: Diagnosis not present

## 2021-07-30 DIAGNOSIS — E669 Obesity, unspecified: Secondary | ICD-10-CM | POA: Diagnosis not present

## 2021-07-30 DIAGNOSIS — D509 Iron deficiency anemia, unspecified: Secondary | ICD-10-CM

## 2021-07-30 DIAGNOSIS — G4700 Insomnia, unspecified: Secondary | ICD-10-CM

## 2021-07-30 DIAGNOSIS — E038 Other specified hypothyroidism: Secondary | ICD-10-CM

## 2021-07-30 DIAGNOSIS — Z713 Dietary counseling and surveillance: Secondary | ICD-10-CM

## 2021-07-30 DIAGNOSIS — F411 Generalized anxiety disorder: Secondary | ICD-10-CM | POA: Diagnosis not present

## 2021-07-30 DIAGNOSIS — E7800 Pure hypercholesterolemia, unspecified: Secondary | ICD-10-CM | POA: Diagnosis not present

## 2021-07-30 LAB — LIPID PANEL
Cholesterol: 247 mg/dL — ABNORMAL HIGH (ref 0–200)
HDL: 63.3 mg/dL (ref >39.00)
LDL Cholesterol: 160 mg/dL — ABNORMAL HIGH (ref 0–99)
NonHDL: 183.3
Total CHOL/HDL Ratio: 4
Triglycerides: 118 mg/dL (ref 0.0–149.0)
VLDL: 23.6 mg/dL (ref 0.0–40.0)

## 2021-07-30 LAB — CBC
HCT: 36.5 % (ref 36.0–46.0)
Hemoglobin: 11.6 g/dL — ABNORMAL LOW (ref 12.0–15.0)
MCHC: 31.8 g/dL (ref 30.0–36.0)
MCV: 85.2 fl (ref 78.0–100.0)
Platelets: 438 10*3/uL — ABNORMAL HIGH (ref 150.0–400.0)
RBC: 4.28 Mil/uL (ref 3.87–5.11)
RDW: 16.3 % — ABNORMAL HIGH (ref 11.5–15.5)
WBC: 5.8 10*3/uL (ref 4.0–10.5)

## 2021-07-30 LAB — COMPREHENSIVE METABOLIC PANEL
ALT: 12 U/L (ref 0–35)
AST: 16 U/L (ref 0–37)
Albumin: 4.4 g/dL (ref 3.5–5.2)
Alkaline Phosphatase: 63 U/L (ref 39–117)
BUN: 15 mg/dL (ref 6–23)
CO2: 28 mEq/L (ref 19–32)
Calcium: 9.3 mg/dL (ref 8.4–10.5)
Chloride: 101 mEq/L (ref 96–112)
Creatinine, Ser: 0.94 mg/dL (ref 0.40–1.20)
GFR: 70.34 mL/min (ref >60.00)
Glucose, Bld: 88 mg/dL (ref 70–99)
Potassium: 4.2 mEq/L (ref 3.5–5.1)
Sodium: 136 mEq/L (ref 135–145)
Total Bilirubin: 0.4 mg/dL (ref 0.2–1.2)
Total Protein: 7.4 g/dL (ref 6.0–8.3)

## 2021-07-30 LAB — TSH: TSH: 3.33 u[IU]/mL (ref 0.35–5.50)

## 2021-07-30 LAB — HEMOGLOBIN A1C: Hgb A1c MFr Bld: 5.9 % (ref 4.6–6.5)

## 2021-07-30 MED ORDER — OZEMPIC (0.25 OR 0.5 MG/DOSE) 2 MG/1.5ML ~~LOC~~ SOPN: 0.2500 mg | PEN_INJECTOR | SUBCUTANEOUS | 0 refills | Status: DC

## 2021-07-30 MED ORDER — SERTRALINE HCL 100 MG PO TABS: 100.0000 mg | ORAL_TABLET | Freq: Every day | ORAL | 1 refills | Status: DC

## 2021-07-30 MED ORDER — TEMAZEPAM 15 MG PO CAPS: ORAL_CAPSULE | ORAL | 1 refills | Status: DC

## 2021-07-30 MED ORDER — OZEMPIC (0.25 OR 0.5 MG/DOSE) 2 MG/1.5ML ~~LOC~~ SOPN: PEN_INJECTOR | SUBCUTANEOUS | 0 refills | Status: DC

## 2021-07-30 NOTE — Progress Notes (Signed)
Office Note 07/30/2021  CC:  Chief Complaint  Patient presents with   Annual Exam    fasting    Patient is a 51 y.o. female who is here for annual health maintenance exam and follow-up insomnia and anxiety. Last visit in August she was stable and we continued her on sertraline 100 mg a day and Restoril 15 mg at night.  INTERIM HX:  Feeling well.  Work going fine.  She is interested in [place] for weight loss. Active on her job but no formal exercise. Thinking about joining a gy with her daughter, who is working on weight loss herself. Admits she eats beyond when she feels full when eats meals, esp supper. Coke zero. She says her goal wt is 150 lbs and duration of time to get this done---1 yr.  Sleeps well as long as she takes temazepam. Mood and anxiety stable on 115m sertraline qd. PMP AWARE reviewed today: most recent rx for temazepam was filled 04/24/21, # 952 rx by me. No red flags.  Past Medical History:  Diagnosis Date   Abnormal mammogram 09/2020   L breast, ? complicated cysts?-->bx 41/61/09showed fibroadenomas x 2-->resume annual mammogram screening.   Anxiety and depression    Eustachian tube dysfunction, right 10/2017   Persistent (6 wks)-->failed nasal steroid, decongestant, and systemic steroid trial.  ENT to do tympanostomy tube as of 11/10/17.   Hepatic hemangioma    small, right side: seen on u/s 2013, no change on CT 2018.   History of adenomatous polyp of colon 10/15/2020   many->rpt 6 mo recommended   Hypercholesterolemia 05/2019   Mild->TLC   Obesity, Class II, BMI 35-39.9    Palpitations 10/2017   48 H Holter with some PAC's and PVCs, brief run of atrial ectopy.  No afib.   Subclinical hypothyroidism 05/2019   TSH around 5. Plan rechk labs 6 mo.    Past Surgical History:  Procedure Laterality Date   48 HR HOLTER  10/2017   48 H Holter with some PAC's and PVCs, brief run of atrial ectopy.  No afib.   BREAST BIOPSY Left 09/24/2020    Fibroadenoma   BUNIONECTOMY     right foot   CESAREAN SECTION  x 2   1995; 2000   COLONOSCOPY W/ POLYPECTOMY  10/15/2020   +adenomas->rpt 6 mo recommended   TONSILLECTOMY  1973   as a child   TUBAL LIGATION  2001   UPPER GASTROINTESTINAL ENDOSCOPY  2013    Family History  Problem Relation Age of Onset   Thyroid disease Mother    Hypertension Mother    Diabetes Mother    Heart disease Mother    Hyperlipidemia Mother    Breast cancer Mother    Hypertension Father    Hyperlipidemia Father    Multiple myeloma Father    Colon cancer Paternal Grandfather    Kidney disease Neg Hx    Asthma Neg Hx    Stroke Neg Hx    Esophageal cancer Neg Hx    Rectal cancer Neg Hx    Stomach cancer Neg Hx    Colon polyps Neg Hx     Social History   Socioeconomic History   Marital status: Married    Spouse name: Not on file   Number of children: 3   Years of education: Not on file   Highest education level: Not on file  Occupational History   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  Lives in [place].   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   Tob: 20 pack-yr hx, quit 2013.   Alc: rare.   No drugs.      Social Determinants of Health   Financial Resource Strain: Not on file  Food Insecurity: Not on file  Transportation Needs: Not on file  Physical Activity: Not on file  Stress: Not on file  Social Connections: Not on file  Intimate Partner Violence: Not on file     Outpatient Medications Prior to Visit  Medication Sig Dispense Refill   sertraline (ZOLOFT) 100 MG tablet TAKE 1 TABLET BY MOUTH EVERY DAY 90 tablet 0   temazepam (RESTORIL) 15 MG capsule 1 tab po qhs as needed for sleep 90 capsule 1   No facility-administered medications prior to visit.    Allergies  Allergen Reactions   Penicillins Hives    ROS Review of Systems  Constitutional:  Negative for appetite change, chills, fatigue and fever.  HENT:  Negative for congestion, dental problem, ear pain and sore throat.   Eyes:  Negative for discharge, redness and visual disturbance.  Respiratory:  Negative for cough, chest tightness, shortness of breath and wheezing.   Cardiovascular:  Negative for chest pain, palpitations and leg swelling.  Gastrointestinal:  Negative for abdominal pain, blood in stool, diarrhea, nausea and vomiting.  Genitourinary:  Negative for difficulty urinating, dysuria, flank pain, frequency, hematuria and urgency.  Musculoskeletal:  Negative for arthralgias, back pain, joint swelling, myalgias and neck stiffness.  Skin:  Negative for pallor and rash.  Neurological:  Negative for dizziness, speech difficulty, weakness and headaches.  Hematological:  Negative for adenopathy. Does not bruise/bleed easily.  Psychiatric/Behavioral:  Negative for confusion and sleep disturbance. The patient is not nervous/anxious.    PE; Vitals with BMI 07/30/2021 01/10/2021 10/15/2020  Height 5' 4"  5' 2"  -  Weight 209 lbs 209 lbs 6 oz -  BMI 43.15 40.08 -  Systolic 676 195 093  Diastolic 76 73 54  Pulse 61 66 51   Exam chaperoned by Deveron Furlong, CMA. Gen: Alert, well appearing.  Patient is oriented to person, place, time, and situation. AFFECT: pleasant, lucid thought and speech. ENT: Ears: EACs clear, normal epithelium.  TMs with good light reflex and landmarks bilaterally.  Eyes: no injection, icteris, swelling, or exudate.  EOMI, PERRLA. Nose: no drainage or turbinate  edema/swelling.  No injection or focal lesion.  Mouth: lips without lesion/swelling.  Oral mucosa pink and moist.  Dentition intact and without obvious caries or gingival swelling.  Oropharynx without erythema, exudate, or swelling.  Neck: supple/nontender.  No LAD, mass, or TM.  Carotid pulses 2+ bilaterally, without bruits. CV: RRR, no m/r/g.   LUNGS: CTA bilat, nonlabored resps, good aeration in all lung fields. ABD: soft, NT, ND, BS normal.  No hepatospenomegaly or mass.  No bruits. EXT: no  clubbing, cyanosis, or edema.  Musculoskeletal: no joint swelling, erythema, warmth, or tenderness.  ROM of all joints intact. Skin - no sores or suspicious lesions or rashes or color changes  Pertinent labs:  Lab Results  Component Value Date   TSH 2.92 05/16/2020   Lab Results  Component Value Date   WBC 7.0 05/16/2020   HGB 13.0 05/16/2020   HCT 39.4 05/16/2020   MCV 90.6 05/16/2020   PLT 411.0 (H) 05/16/2020   Lab Results  Component Value Date   CREATININE 0.96 05/16/2020   BUN 14 05/16/2020   NA 138 05/16/2020   K 4.4 05/16/2020   CL 103 05/16/2020   CO2 27 05/16/2020   Lab Results  Component Value Date   ALT 14 05/16/2020   AST 16 05/16/2020   ALKPHOS 60 05/16/2020   BILITOT 0.6 05/16/2020   Lab Results  Component Value Date   CHOL 229 (H) 05/09/2019   Lab Results  Component Value Date   HDL 56.60 05/09/2019   Lab Results  Component Value Date   LDLCALC 152 (H) 05/09/2019   Lab Results  Component Value Date   TRIG 102.0 05/09/2019   Lab Results  Component Value Date   CHOLHDL 4 05/09/2019   ASSESSMENT AND PLAN:   #1 insomnia, well controlled on temazepam 15 mg nightly. Controlled substance contract updated today. Prescription for temazepam 15 mg, 1 p.o. nightly, #90, refill x1.  #2 GAD, well controlled on sertraline 100 mg a day. Refilled today.  3.  Weight loss counseling, discussion of medication assistance with weight loss. We discussed necessary  dietary changes including modification of types of foods she eats, portion size, avoid snacking, avoid colas, increase fruits and vegetables and lean meats. Encouraged her to start daily exercise regimen, starting slow and working her way up. We will start Ozempic 0.25 mg q. 7 days x 4 and then increase to 0.5 mg q. 7-day dosing. Therapeutic expectations and side effect profile of medication discussed today.  Patient's questions answered. Hemoglobin A1c and fasting glucose today.  She states that her goal weight is 150 pounds and she hopes to attain this in approximately 1 year. Health maintenance exam: Reviewed age and gender appropriate health maintenance issues (prudent diet, regular exercise, health risks of tobacco and excessive alcohol, use of seatbelts, fire alarms in home, use of sunscreen).  Also reviewed age and gender appropriate health screening as well as vaccine recommendations. Vaccines: Tdap->given today.  Flu->given today. Labs: HP + a1c Cervical ca screening: per Dr. Stann Mainland. Breast ca screening: via Dr. Stann Mainland, rpt due 09/2021. Colon ca screening: She had screening colonoscopy on 10/15/2020 and many polyps were found.  It was recommended that she have a follow-up colonoscopy 6 months later-->she will contact GI for this.  An After Visit Summary was printed and given to the patient.  FOLLOW UP:  Return in about 2 months (around 09/27/2021) for wt loss counseling/med.  Signed:  Crissie Sickles, MD           07/30/2021

## 2021-07-30 NOTE — Patient Instructions (Signed)

## 2021-07-31 ENCOUNTER — Encounter: Payer: Self-pay | Admitting: Family Medicine

## 2021-07-31 ENCOUNTER — Other Ambulatory Visit (INDEPENDENT_AMBULATORY_CARE_PROVIDER_SITE_OTHER): Payer: BC Managed Care – PPO

## 2021-07-31 ENCOUNTER — Other Ambulatory Visit: Payer: Self-pay | Admitting: Family Medicine

## 2021-07-31 DIAGNOSIS — D649 Anemia, unspecified: Secondary | ICD-10-CM

## 2021-07-31 DIAGNOSIS — E538 Deficiency of other specified B group vitamins: Secondary | ICD-10-CM

## 2021-07-31 DIAGNOSIS — D509 Iron deficiency anemia, unspecified: Secondary | ICD-10-CM

## 2021-07-31 LAB — IBC + FERRITIN
Ferritin: 7.8 ng/mL — ABNORMAL LOW (ref 10.0–291.0)
Iron: 49 ug/dL (ref 42–145)
Saturation Ratios: 8.6 % — ABNORMAL LOW (ref 20.0–50.0)
TIBC: 571.2 ug/dL — ABNORMAL HIGH (ref 250.0–450.0)
Transferrin: 408 mg/dL — ABNORMAL HIGH (ref 212.0–360.0)

## 2021-07-31 LAB — VITAMIN B12: Vitamin B-12: 209 pg/mL — ABNORMAL LOW (ref 211–911)

## 2021-08-06 ENCOUNTER — Other Ambulatory Visit: Payer: Self-pay

## 2021-08-06 DIAGNOSIS — D509 Iron deficiency anemia, unspecified: Secondary | ICD-10-CM

## 2021-08-06 LAB — HEMOCCULT SLIDES (X 3 CARDS)
Fecal Occult Blood: NEGATIVE
OCCULT 1: NEGATIVE
OCCULT 2: NEGATIVE
OCCULT 3: NEGATIVE
OCCULT 4: NEGATIVE
OCCULT 5: NEGATIVE

## 2021-08-06 NOTE — Addendum Note (Signed)
Addended by: Octaviano Glow on: 08/06/2021 08:51 AM   Modules accepted: Orders

## 2021-09-12 ENCOUNTER — Telehealth: Payer: Self-pay | Admitting: Family Medicine

## 2021-09-12 ENCOUNTER — Other Ambulatory Visit: Payer: Self-pay | Admitting: Family Medicine

## 2021-09-12 ENCOUNTER — Other Ambulatory Visit (HOSPITAL_COMMUNITY): Payer: Self-pay

## 2021-09-12 LAB — HM MAMMOGRAPHY

## 2021-09-12 MED ORDER — OZEMPIC (0.25 OR 0.5 MG/DOSE) 2 MG/3ML ~~LOC~~ SOPN
PEN_INJECTOR | SUBCUTANEOUS | 0 refills | Status: DC
  Filled 2021-09-12: qty 3, 28d supply, fill #0

## 2021-09-12 MED ORDER — OZEMPIC (0.25 OR 0.5 MG/DOSE) 2 MG/1.5ML ~~LOC~~ SOPN: PEN_INJECTOR | SUBCUTANEOUS | 0 refills | Status: DC

## 2021-09-12 NOTE — Telephone Encounter (Signed)
Last refill 07/30/21(1.44m,0) during last OV. Next follow up scheduled 09/27/21. ? ?Patient Comment: Injection due today but ozempic pen has malfunctioned. I have an upcoming appt, but will need new script until then. I need to inject my 2nd dose of 0.'5mg'$  today. Thanks! ?  ?

## 2021-09-12 NOTE — Telephone Encounter (Signed)
Pt called stating pharmacy advised her manufacturer discontinued the 1.20m and now it comes in 3.048m? ?Pt states pharmacy she normally uses does not have in stock. ? ?Send new order to: ? ?[facility] Phone:  [phone]?Fax:  [phone]?  ? ?Call back # [phone]

## 2021-09-12 NOTE — Telephone Encounter (Signed)
Last refill 07/30/21(1.27m,0) during last OV. Next follow up scheduled 09/27/21. Rx refilled today for 1.572mand sent to [facility] but they do not have it in stock.  ? ?[facility]

## 2021-09-13 ENCOUNTER — Other Ambulatory Visit (HOSPITAL_COMMUNITY): Payer: Self-pay

## 2021-09-13 ENCOUNTER — Other Ambulatory Visit: Payer: Self-pay | Admitting: Obstetrics & Gynecology

## 2021-09-13 DIAGNOSIS — R928 Other abnormal and inconclusive findings on diagnostic imaging of breast: Secondary | ICD-10-CM

## 2021-09-13 MED ORDER — SEMAGLUTIDE (1 MG/DOSE) 4 MG/3ML ~~LOC~~ SOPN
PEN_INJECTOR | SUBCUTANEOUS | 0 refills | Status: DC
  Filled 2021-09-13: qty 3, fill #0

## 2021-09-14 ENCOUNTER — Other Ambulatory Visit: Payer: Self-pay | Admitting: Family Medicine

## 2021-09-14 ENCOUNTER — Other Ambulatory Visit (HOSPITAL_COMMUNITY): Payer: Self-pay

## 2021-09-16 ENCOUNTER — Other Ambulatory Visit (HOSPITAL_COMMUNITY): Payer: Self-pay

## 2021-09-16 MED ORDER — OZEMPIC (0.25 OR 0.5 MG/DOSE) 2 MG/1.5ML ~~LOC~~ SOPN: 0.5000 mg | PEN_INJECTOR | SUBCUTANEOUS | 1 refills | Status: DC

## 2021-09-16 NOTE — Telephone Encounter (Signed)
Pharmacy comment regarding sig directions: the 1 mg can't dial up to 0.5, only goes to '1mg'$ . Please resend for 0.5 pen or change to sig of '1mg'$  ? ? ?Please review and advise ? ?

## 2021-09-16 NOTE — Telephone Encounter (Signed)
LM for pt to return call regarding medication 

## 2021-09-16 NOTE — Telephone Encounter (Signed)
Gabriella Stephens, Ozempic prescription sent as per patient request. ?

## 2021-09-18 ENCOUNTER — Other Ambulatory Visit: Payer: Self-pay | Admitting: Family Medicine

## 2021-09-18 MED ORDER — OZEMPIC (0.25 OR 0.5 MG/DOSE) 2 MG/3ML ~~LOC~~ SOPN
PEN_INJECTOR | SUBCUTANEOUS | 1 refills | Status: DC
Start: 2021-09-18 — End: 2021-09-27

## 2021-09-18 NOTE — Telephone Encounter (Signed)
Is everything taken care of with this 1?  It was still in my "patient calls" box. ?

## 2021-09-18 NOTE — Telephone Encounter (Signed)
New rx still needed. Original rx for 70m was d/c  ? ?Please Advise. ?

## 2021-09-18 NOTE — Telephone Encounter (Signed)
Please advise. Pt needs updated rx for 25m instead. 1.5 has been d/c ?

## 2021-09-18 NOTE — Telephone Encounter (Signed)
LM for pt to return call regarding medication 

## 2021-09-18 NOTE — Addendum Note (Signed)
Addended by: Deveron Furlong D on: 09/18/2021 09:43 AM ? ? Modules accepted: Orders ? ?

## 2021-09-18 NOTE — Telephone Encounter (Signed)
OK rx sent ?

## 2021-09-19 ENCOUNTER — Other Ambulatory Visit (HOSPITAL_COMMUNITY): Payer: Self-pay

## 2021-09-19 MED ORDER — SEMAGLUTIDE (1 MG/DOSE) 4 MG/3ML ~~LOC~~ SOPN
PEN_INJECTOR | SUBCUTANEOUS | 1 refills | Status: DC
  Filled 2021-09-19: qty 3, 28d supply, fill #0

## 2021-09-19 NOTE — Addendum Note (Signed)
Addended by: Deveron Furlong D on: 09/19/2021 03:20 PM ? ? Modules accepted: Orders ? ?

## 2021-09-19 NOTE — Telephone Encounter (Signed)
Doren Custard from [facility] ? ?Semaglutide,0.25 or 0.'5MG'$ /DOS, (OZEMPIC, 0.25 OR 0.5 MG/DOSE,) 2 MG/3ML SOPN ? ? ?Should this be 1 mg or no? ? ?Call back: [phone] ? ? ? ? ? ?

## 2021-09-19 NOTE — Telephone Encounter (Signed)
Please review and advise.

## 2021-09-27 ENCOUNTER — Encounter: Payer: Self-pay | Admitting: Family Medicine

## 2021-09-27 ENCOUNTER — Ambulatory Visit (INDEPENDENT_AMBULATORY_CARE_PROVIDER_SITE_OTHER): Payer: BC Managed Care – PPO | Admitting: Family Medicine

## 2021-09-27 VITALS — BP 107/66 | HR 68 | Temp 98.2°F | Ht 64.0 in | Wt 188.6 lb

## 2021-09-27 DIAGNOSIS — D500 Iron deficiency anemia secondary to blood loss (chronic): Secondary | ICD-10-CM | POA: Diagnosis not present

## 2021-09-27 DIAGNOSIS — Z7689 Persons encountering health services in other specified circumstances: Secondary | ICD-10-CM

## 2021-09-27 DIAGNOSIS — R7303 Prediabetes: Secondary | ICD-10-CM | POA: Diagnosis not present

## 2021-09-27 DIAGNOSIS — E669 Obesity, unspecified: Secondary | ICD-10-CM | POA: Diagnosis not present

## 2021-09-27 MED ORDER — SEMAGLUTIDE (1 MG/DOSE) 4 MG/3ML ~~LOC~~ SOPN: PEN_INJECTOR | SUBCUTANEOUS | 2 refills | Status: DC

## 2021-09-27 NOTE — Progress Notes (Signed)
Marland KitchenOFFICE VISIT ? ?09/30/2021 ? ?CC:  ?Chief Complaint  ?Patient presents with  ? Weight Loss  ?  Follow up;   ? ?HPI:   ? ?Patient is a 51 y.o. female who presents for 55-monthfollow-up weight management. ?A/P as of last visit: ?"#1 insomnia, well controlled on temazepam 15 mg nightly. ?Controlled substance contract updated today. ?Prescription for temazepam 15 mg, 1 p.o. nightly, #90, refill x1. ?  ?#2 GAD, well controlled on sertraline 100 mg a day. ?Refilled today. ? ?3.  Weight loss counseling, discussion of medication assistance with weight loss. ?We discussed necessary dietary changes including modification of types of foods she eats, portion size, avoid snacking, avoid colas, increase fruits and vegetables and lean meats. ?Encouraged her to start daily exercise regimen, starting slow and working her way up. ?We will start Ozempic 0.25 mg q. 7 days x 4 and then increase to 0.5 mg q. 7-day dosing. ?Therapeutic expectations and side effect profile of medication discussed today.  Patient's questions answered. ?Hemoglobin A1c and fasting glucose today. ?  ?She states that her goal weight is 150 pounds and she hopes to attain this in approximately 1 year. ?Health maintenance exam: ?Reviewed age and gender appropriate health maintenance issues (prudent diet, regular exercise, health risks of tobacco and excessive alcohol, use of seatbelts, fire alarms in home, use of sunscreen).  Also reviewed age and gender appropriate health screening as well as vaccine recommendations. ?Vaccines: Tdap->given today.  Flu->given today. ?Labs: HP + a1c ?Cervical ca screening: per Dr. WStann Mainland ?Breast ca screening: via Dr. WStann Mainland rpt due 09/2021. ?Colon ca screening: She had screening colonoscopy on 10/15/2020 and many polyps were found.  It was recommended that she have a follow-up colonoscopy 6 months later-->she will contact GI for this." ? ?INTERIM HX: ?Labs last visit showed B12 deficiency as well as iron deficiency.  Her hemoglobin was  11.6. ?Started iron supplement.  Hemoccults negative.  She does have heavy bleeding with menses. ?Also, encouraged her once again to follow-up with her GI MD. ? ?She is tolerating iron well for the last 2 months.  She does tell me today that she typically gives blood with [facility] about every 3 months.  She even was able to donate within the last month.  They checked her hemoglobin there and said it was 12.6. ? ?Doing well on Ozempic.  Finishing up 0.5 mg weekly. ?She is ready to increase to 1 mg weekly ? ?ROS as above, plus--> no fevers, no CP, no SOB, no wheezing, no cough, no dizziness, no HAs, no rashes, no melena/hematochezia.  No polyuria or polydipsia.  No myalgias or arthralgias.  No focal weakness, paresthesias, or tremors.  No acute vision or hearing abnormalities.  No dysuria or unusual/new urinary urgency or frequency.  No recent changes in lower legs. ?No n/v/d or abd pain.  No palpitations.   ? ?Past Medical History:  ?Diagnosis Date  ? Abnormal mammogram 09/2020  ? L breast, ? complicated cysts?-->bx 49/93/57showed fibroadenomas x 2-->resume annual mammogram screening.  ? Anxiety and depression   ? Eustachian tube dysfunction, right 10/2017  ? Persistent (6 wks)-->failed nasal steroid, decongestant, and systemic steroid trial.  ENT to do tympanostomy tube as of 11/10/17.  ? Hepatic hemangioma   ? small, right side: seen on u/s 2013, no change on CT 2018.  ? History of adenomatous polyp of colon 10/15/2020  ? many->rpt 6 mo recommended  ? Hypercholesterolemia 05/2019  ? Mild->TLC  ? Iron deficiency anemia 07/2021  ?  Obesity, Class II, BMI 35-39.9   ? Palpitations 10/2017  ? 48 H Holter with some PAC's and PVCs, brief run of atrial ectopy.  No afib.  ? Prediabetes   ? Subclinical hypothyroidism 05/2019  ? TSH around 5. Plan rechk labs 6 mo.  ? Vitamin B12 deficiency 07/2021  ? ? ?Past Surgical History:  ?Procedure Laterality Date  ? 48 HR HOLTER  10/2017  ? 48 H Holter with some PAC's and PVCs, brief run  of atrial ectopy.  No afib.  ? BREAST BIOPSY Left 09/24/2020  ? Fibroadenoma  ? BUNIONECTOMY    ? right foot  ? CESAREAN SECTION  x 2  ? 1995; 2000  ? COLONOSCOPY W/ POLYPECTOMY  10/15/2020  ? +adenomas->rpt 6 mo recommended  ? TONSILLECTOMY  1973  ? as a child  ? TUBAL LIGATION  2001  ? UPPER GASTROINTESTINAL ENDOSCOPY  2013  ? for epigastric pain. Mild antral gastritis on visualization, bx/ h pylori neg  ? ? ?Outpatient Medications Prior to Visit  ?Medication Sig Dispense Refill  ? sertraline (ZOLOFT) 100 MG tablet Take 1 tablet (100 mg total) by mouth daily. 90 tablet 1  ? temazepam (RESTORIL) 15 MG capsule 1 tab po qhs as needed for sleep 90 capsule 1  ? Semaglutide, 1 MG/DOSE, 4 MG/3ML SOPN Inject 0.'5mg'$  into the skin once a week. 3 mL 1  ? Semaglutide,0.25 or 0.'5MG'$ /DOS, (OZEMPIC, 0.25 OR 0.5 MG/DOSE,) 2 MG/3ML SOPN 0.'5mg'$  subQ q week 3 mL 1  ? ?No facility-administered medications prior to visit.  ? ? ?Allergies  ?Allergen Reactions  ? Penicillins Hives  ? ? ?ROS ?As per HPI ? ?PE: ? ?  09/27/2021  ?  1:37 PM 07/30/2021  ?  9:20 AM 01/10/2021  ?  8:55 AM  ?Vitals with BMI  ?Height '5\' 4"'$  '5\' 4"'$  '5\' 2"'$   ?Weight 188 lbs 10 oz 209 lbs 209 lbs 6 oz  ?BMI 32.36 35.86 38.29  ?Systolic 735 329 924  ?Diastolic 66 76 73  ?Pulse 68 61 66  ? ? ?Physical Exam ? ?Gen: Alert, well appearing.  Patient is oriented to person, place, time, and situation. ?AFFECT: pleasant, lucid thought and speech. ?No pallor. ?No further exam today. ? ?LABS:  ?Last CBC ?Lab Results  ?Component Value Date  ? WBC 5.8 07/30/2021  ? HGB 11.6 (L) 07/30/2021  ? HCT 36.5 07/30/2021  ? MCV 85.2 07/30/2021  ? MCH 30.0 12/20/2016  ? RDW 16.3 (H) 07/30/2021  ? PLT 438.0 (H) 07/30/2021  ? ?Last metabolic panel ?Lab Results  ?Component Value Date  ? GLUCOSE 88 07/30/2021  ? NA 136 07/30/2021  ? K 4.2 07/30/2021  ? CL 101 07/30/2021  ? CO2 28 07/30/2021  ? BUN 15 07/30/2021  ? CREATININE 0.94 07/30/2021  ? GFRNONAA >60 12/20/2016  ? CALCIUM 9.3 07/30/2021  ? PROT  7.4 07/30/2021  ? ALBUMIN 4.4 07/30/2021  ? BILITOT 0.4 07/30/2021  ? ALKPHOS 63 07/30/2021  ? AST 16 07/30/2021  ? ALT 12 07/30/2021  ? ANIONGAP 10 12/20/2016  ? ?Last lipids ?Lab Results  ?Component Value Date  ? CHOL 247 (H) 07/30/2021  ? HDL 63.30 07/30/2021  ? LDLCALC 160 (H) 07/30/2021  ? TRIG 118.0 07/30/2021  ? CHOLHDL 4 07/30/2021  ? ?Last hemoglobin A1c ?Lab Results  ?Component Value Date  ? HGBA1C 5.9 07/30/2021  ? ?Last thyroid functions ?Lab Results  ?Component Value Date  ? TSH 3.33 07/30/2021  ? T3TOTAL 118 05/16/2020  ? ?IMPRESSION  AND PLAN: ? ?#1 weight management encounter, obesity class II ?Comorbidities:  prediabetes, hyperlipidemia. ?Has had about 20 pounds of weight loss on Ozempic x2 months. ?Increase Ozempic to 1 mg subcu weekly.  Continue making improvements on diet and exercise habits ?Plan recheck hemoglobin A1c at next follow-up in 2 months. ? ?#2 iron deficiency anemia. ?Due to combination of excessive blood loss with menses as well as frequent blood donations. ?Stop blood donations for now. ?Has done well on iron for 2 months. ?Plan recheck CBC and iron at next follow-up in 2 months. ? ?An After Visit Summary was printed and given to the patient. ? ?FOLLOW UP: Return in about 2 months (around 11/27/2021) for routine chronic illness f/u. ? ?Signed:  Crissie Sickles, MD           09/30/2021 ? ?

## 2021-10-08 ENCOUNTER — Ambulatory Visit
Admission: RE | Admit: 2021-10-08 | Discharge: 2021-10-08 | Disposition: A | Discharge status: Home / Self Care | Payer: BC Managed Care – PPO | Source: Ambulatory Visit | Attending: Obstetrics & Gynecology | Admitting: Obstetrics & Gynecology

## 2021-10-08 ENCOUNTER — Ambulatory Visit: Payer: BC Managed Care – PPO

## 2021-10-08 DIAGNOSIS — R928 Other abnormal and inconclusive findings on diagnostic imaging of breast: Secondary | ICD-10-CM

## 2021-10-08 LAB — HM MAMMOGRAPHY

## 2021-10-10 ENCOUNTER — Encounter: Payer: Self-pay | Admitting: Internal Medicine

## 2021-10-28 ENCOUNTER — Telehealth: Payer: Self-pay | Admitting: *Deleted

## 2021-10-28 NOTE — Telephone Encounter (Signed)
Attempted to call x 2 message left with call back number to return call by 5 pm today to reschedule pre-visit or upcoming procedure on 11/18/21 will be cancelled.

## 2021-10-28 NOTE — Telephone Encounter (Signed)
Patient was rescheduled for 5/23 at 2:30

## 2021-10-28 NOTE — Telephone Encounter (Signed)
Pt.called back rescheduled pre-visit for 10/29/21

## 2021-10-29 ENCOUNTER — Encounter: Payer: Self-pay | Admitting: *Deleted

## 2021-10-29 ENCOUNTER — Ambulatory Visit (AMBULATORY_SURGERY_CENTER): Payer: BC Managed Care – PPO | Admitting: *Deleted

## 2021-10-29 VITALS — Ht 63.0 in | Wt 181.0 lb

## 2021-10-29 DIAGNOSIS — Z860100 Personal history of colon polyps, unspecified: Secondary | ICD-10-CM

## 2021-10-29 MED ORDER — NA SULFATE-K SULFATE-MG SULF 17.5-3.13-1.6 GM/177ML PO SOLN: 1.0000 | Freq: Once | ORAL | 0 refills | Status: AC

## 2021-10-29 NOTE — Telephone Encounter (Signed)
Error

## 2021-10-29 NOTE — Progress Notes (Signed)
No egg or soy allergy known to patient  No issues known to pt with past sedation with any surgeries or procedures Patient denies ever being told they had issues or difficulty with intubation  No FH of Malignant Hyperthermia Pt is not on diet pills Pt is not on  home 02  Pt is not on blood thinners  Pt states issues with constipation - has a bm 2 x a week on hte fiber- if no fiber less - will do a 2 day prep  No A fib or A flutter   NO PA's for preps discussed with pt In PV today  Discussed with pt there will be an out-of-pocket cost for prep and that varies from $0 to 70 +  dollars - pt verbalized understanding  Pt instructed to use Singlecare.com or GoodRx for a price reduction on prep   PV completed over the phone. Pt verified name, DOB, address and insurance during PV today.  Pt mailed instruction packet with copy of consent form to read and not return, and instructions.  Pt encouraged to call with questions or issues.  If pt has My chart, procedure instructions sent via My Chart  Insurance confirmed with pt at [facility] today

## 2021-11-13 ENCOUNTER — Encounter: Payer: Self-pay | Admitting: Internal Medicine

## 2021-11-18 ENCOUNTER — Ambulatory Visit (AMBULATORY_SURGERY_CENTER): Payer: BC Managed Care – PPO | Admitting: Internal Medicine

## 2021-11-18 ENCOUNTER — Encounter: Payer: BC Managed Care – PPO | Admitting: Internal Medicine

## 2021-11-18 ENCOUNTER — Encounter: Payer: Self-pay | Admitting: Internal Medicine

## 2021-11-18 VITALS — BP 125/72 | HR 65 | Temp 98.4°F | Resp 19 | Ht 64.0 in | Wt 181.0 lb

## 2021-11-18 DIAGNOSIS — D123 Benign neoplasm of transverse colon: Secondary | ICD-10-CM

## 2021-11-18 DIAGNOSIS — Z09 Encounter for follow-up examination after completed treatment for conditions other than malignant neoplasm: Secondary | ICD-10-CM

## 2021-11-18 DIAGNOSIS — D124 Benign neoplasm of descending colon: Secondary | ICD-10-CM

## 2021-11-18 DIAGNOSIS — Z86010 Personal history of colonic polyps: Secondary | ICD-10-CM

## 2021-11-18 MED ORDER — SODIUM CHLORIDE 0.9 % IV SOLN: 500.0000 mL | Freq: Once | INTRAVENOUS | Status: DC

## 2021-11-18 NOTE — Progress Notes (Signed)
Pt's states no medical or surgical changes since previsit or office visit. 

## 2021-11-18 NOTE — Patient Instructions (Signed)
YOU HAD AN ENDOSCOPIC PROCEDURE TODAY AT [facility]:   Refer to the procedure report that was given to you for any specific questions about what was found during the examination.  If the procedure report does not answer your questions, please call your gastroenterologist to clarify.  If you requested that your care partner not be given the details of your procedure findings, then the procedure report has been included in a sealed envelope for you to review at your convenience later.  YOU SHOULD EXPECT: Some feelings of bloating in the abdomen. Passage of more gas than usual.  Walking can help get rid of the air that was put into your GI tract during the procedure and reduce the bloating. If you had a lower endoscopy (such as a colonoscopy or flexible sigmoidoscopy) you may notice spotting of blood in your stool or on the toilet paper. If you underwent a bowel prep for your procedure, you may not have a normal bowel movement for a few days.  Please Note:  You might notice some irritation and congestion in your nose or some drainage.  This is from the oxygen used during your procedure.  There is no need for concern and it should clear up in a day or so.  SYMPTOMS TO REPORT IMMEDIATELY:  Following lower endoscopy (colonoscopy or flexible sigmoidoscopy):  Excessive amounts of blood in the stool  Significant tenderness or worsening of abdominal pains  Swelling of the abdomen that is new, acute  Fever of 100F or higher  Following upper endoscopy (EGD)  Vomiting of blood or coffee ground material  New chest pain or pain under the shoulder blades  Painful or persistently difficult swallowing  New shortness of breath  Fever of 100F or higher  Black, tarry-looking stools  For urgent or emergent issues, a gastroenterologist can be reached at any hour by calling [phone]. Do not use MyChart messaging for urgent concerns.    DIET:  We do recommend a small meal at first, but  then you may proceed to your regular diet.  Drink plenty of fluids but you should avoid alcoholic beverages for 24 hours.  ACTIVITY:  You should plan to take it easy for the rest of today and you should NOT DRIVE or use heavy machinery until tomorrow (because of the sedation medicines used during the test).    FOLLOW UP: Our staff will call the number listed on your records 24-72 hours following your procedure to check on you and address any questions or concerns that you may have regarding the information given to you following your procedure. If we do not reach you, we will leave a message.  We will attempt to reach you two times.  During this call, we will ask if you have developed any symptoms of COVID 19. If you develop any symptoms (ie: fever, flu-like symptoms, shortness of breath, cough etc.) before then, please call [phone].  If you test positive for Covid 19 in the 2 weeks post procedure, please call and report this information to [place].    If any biopsies were taken you will be contacted by phone or by letter within the next 1-3 weeks.  Please call us at [phone] if you have not heard about the biopsies in 3 weeks.    SIGNATURES/CONFIDENTIALITY: You and/or your care partner have signed paperwork which will be entered into your electronic medical record.  These signatures attest to the fact that that the information above on your After  Visit Summary has been reviewed and is understood.  Full responsibility of the confidentiality of this discharge information lies with you and/or your care-partner.

## 2021-11-18 NOTE — Op Note (Signed)
[place] Patient Name: Gabriella Stephens Procedure Date: 11/18/2021 2:10 PM MRN: [id number] Endoscopist: Jerene Bears , MD Age: 51 Referring MD:  Date of Birth: 1971/02/27 Gender: Female Account #: [id number] Procedure:                Colonoscopy Indications:              Surveillance: Piecemeal removal of large sessile                            serrated polyp (25 mm) and 4 additional                            subcentimeter adenomas at last colonoscopy May 2022 Medicines:                Monitored Anesthesia Care Procedure:                Pre-Anesthesia Assessment:                           - Prior to the procedure, a History and Physical                            was performed, and patient medications and                            allergies were reviewed. The patient's tolerance of                            previous anesthesia was also reviewed. The risks                            and benefits of the procedure and the sedation                            options and risks were discussed with the patient.                            All questions were answered, and informed consent                            was obtained. Prior Anticoagulants: The patient has                            taken no previous anticoagulant or antiplatelet                            agents. ASA Grade Assessment: II - A patient with                            mild systemic disease. After reviewing the risks                            and benefits, the patient was deemed in  satisfactory condition to undergo the procedure.                           After obtaining informed consent, the colonoscope                            was passed under direct vision. Throughout the                            procedure, the patient's blood pressure, pulse, and                            oxygen saturations were monitored continuously. The                            Olympus PCF-H190DL  (#[id number]) Colonoscope was                            introduced through the anus and advanced to the                            terminal ileum. The colonoscopy was performed                            without difficulty. The patient tolerated the                            procedure well. The quality of the bowel                            preparation was good. The ileocecal valve,                            appendiceal orifice, and rectum were photographed. Scope In: 3:01:13 PM Scope Out: 3:16:29 PM Scope Withdrawal Time: 0 hours 12 minutes 12 seconds  Total Procedure Duration: 0 hours 15 minutes 16 seconds  Findings:                 The digital rectal exam was normal.                           There is no endoscopic evidence of residual polyp                            in the cecum.                           The terminal ileum appeared normal.                           A 2 mm polyp was found in the transverse colon. The                            polyp was sessile. The polyp was removed with a  cold snare. Resection and retrieval were complete.                           A 4 mm polyp was found in the descending colon. The                            polyp was sessile. The polyp was removed with a                            cold snare. Resection and retrieval were complete.                           Scattered small-mouthed diverticula were found in                            the sigmoid colon, descending colon and ascending                            colon.                           The retroflexed view of the distal rectum and anal                            verge was normal and showed no anal or rectal                            abnormalities. Complications:            No immediate complications. Estimated Blood Loss:     Estimated blood loss was minimal. Impression:               - The examined portion of the ileum was normal.                           - No  evidence of residual polyp in the cecum.                           - One 2 mm polyp in the transverse colon, removed                            with a cold snare. Resected and retrieved.                           - One 4 mm polyp in the descending colon, removed                            with a cold snare. Resected and retrieved.                           - Mild diverticulosis in the sigmoid colon, in the                            descending colon and in  the ascending colon.                           - The distal rectum and anal verge are normal on                            retroflexion view. Recommendation:           - Patient has a contact number available for                            emergencies. The signs and symptoms of potential                            delayed complications were discussed with the                            patient. Return to normal activities tomorrow.                            Written discharge instructions were provided to the                            patient.                           - Resume previous diet.                           - Continue present medications.                           - Await pathology results.                           - Repeat colonoscopy is recommended for                            surveillance. The colonoscopy date will be                            determined after pathology results from today's                            exam become available for review. Jerene Bears, MD 11/18/2021 3:21:23 PM This report has been signed electronically.

## 2021-11-18 NOTE — Progress Notes (Signed)
Called to room to assist during endoscopic procedure.  Patient ID and intended procedure confirmed with present staff. Received instructions for my participation in the procedure from the performing physician.  

## 2021-11-18 NOTE — Progress Notes (Signed)
Vss nad trans to pacu °

## 2021-11-18 NOTE — Progress Notes (Signed)
GASTROENTEROLOGY PROCEDURE H&P NOTE   Primary Care Physician: Tammi Sou, MD    Reason for Procedure:  Surveillance colonoscopy, piecemeal polypectomy for 25 mm cecal SSP in May 2022  Plan:    Surveillance colonoscopy  Patient is appropriate for endoscopic procedure(s) in the ambulatory ([place]) setting.  The nature of the procedure, as well as the risks, benefits, and alternatives were carefully and thoroughly reviewed with the patient. Ample time for discussion and questions allowed. The patient understood, was satisfied, and agreed to proceed.     HPI: Gabriella Stephens is a 51 y.o. female who presents for surveillance colonoscopy.  Medical history as below.  Tolerated the prep.  No recent chest pain or shortness of breath.  No abdominal pain today.  Past Medical History:  Diagnosis Date   Abnormal mammogram 09/2020   L breast, ? complicated cysts?-->bx 08/08/58 showed fibroadenomas x 2-->resume annual mammogram screening.   Anxiety and depression    Eustachian tube dysfunction, right 10/2017   Persistent (6 wks)-->failed nasal steroid, decongestant, and systemic steroid trial.  ENT to do tympanostomy tube as of 11/10/17.   Hepatic hemangioma    small, right side: seen on u/s 2013, no change on CT 2018.   History of adenomatous polyp of colon 10/15/2020   many->rpt 6 mo recommended   Hypercholesterolemia 05/2019   Mild->TLC   Iron deficiency anemia 07/2021   Obesity, Class II, BMI 35-39.9    Palpitations 10/2017   48 H Holter with some PAC's and PVCs, brief run of atrial ectopy.  No afib.   Prediabetes    Subclinical hypothyroidism 05/2019   TSH around 5. Plan rechk labs 6 mo.   Vitamin B12 deficiency 07/2021    Past Surgical History:  Procedure Laterality Date   84 HR HOLTER  10/2017   48 H Holter with some PAC's and PVCs, brief run of atrial ectopy.  No afib.   BREAST BIOPSY Left 09/24/2020   Fibroadenoma   BUNIONECTOMY     right foot   CESAREAN SECTION   x 2   1995; 2000   COLONOSCOPY     COLONOSCOPY W/ POLYPECTOMY  10/15/2020   +adenomas->rpt 6 mo recommended   POLYPECTOMY     TONSILLECTOMY  1973   as a child   TUBAL LIGATION  2001   UPPER GASTROINTESTINAL ENDOSCOPY  2013   for epigastric pain. Mild antral gastritis on visualization, bx/ h pylori neg    Prior to Admission medications   Medication Sig Start Date End Date Taking? Authorizing Provider  sertraline (ZOLOFT) 100 MG tablet Take 1 tablet (100 mg total) by mouth daily. 07/30/21  Yes McGowen, Adrian Blackwater, MD  temazepam (RESTORIL) 15 MG capsule 1 tab po qhs as needed for sleep 07/30/21  Yes McGowen, Adrian Blackwater, MD  Cyanocobalamin (VITAMIN B 12 PO) Take by mouth.    [provider]  ferrous sulfate 325 (65 FE) MG EC tablet Take 325 mg by mouth 3 (three) times daily with meals.    [provider]  FIBER ADULT GUMMIES PO Take by mouth.    [provider]  Semaglutide, 1 MG/DOSE, 4 MG/3ML SOPN Inject 1 mg into the skin once a week. 09/27/21   McGowenAdrian Blackwater, MD    Current Outpatient Medications  Medication Sig Dispense Refill   sertraline (ZOLOFT) 100 MG tablet Take 1 tablet (100 mg total) by mouth daily. 90 tablet 1   temazepam (RESTORIL) 15 MG capsule 1 tab po qhs as  needed for sleep 90 capsule 1   Cyanocobalamin (VITAMIN B 12 PO) Take by mouth.     ferrous sulfate 325 (65 FE) MG EC tablet Take 325 mg by mouth 3 (three) times daily with meals.     FIBER ADULT GUMMIES PO Take by mouth.     Semaglutide, 1 MG/DOSE, 4 MG/3ML SOPN Inject 1 mg into the skin once a week. 3 mL 2   Current Facility-Administered Medications  Medication Dose Route Frequency Provider Last Rate Last Admin   0.9 %  sodium chloride infusion  500 mL Intravenous Once Ason Heslin, Lajuan Lines, MD        Allergies as of 11/18/2021 - Review Complete 11/18/2021  Allergen Reaction Noted   Penicillins Hives 04/06/2012    Family History  Problem Relation Age of Onset   Thyroid disease Mother     Hypertension Mother    Diabetes Mother    Heart disease Mother    Hyperlipidemia Mother    Breast cancer Mother    Hypertension Father    Hyperlipidemia Father    Multiple myeloma Father    Colon cancer Paternal Grandfather    Kidney disease Neg Hx    Asthma Neg Hx    Stroke Neg Hx    Esophageal cancer Neg Hx    Rectal cancer Neg Hx    Stomach cancer Neg Hx    Colon polyps Neg Hx     Social History   Socioeconomic History   Marital status: Married    Spouse name: Not on file   Number of children: 3   Years of education: Not on file   Highest education level: Not on file  Occupational History   Occupation: Best boy: [employer]    Employer: [employer]  Tobacco Use   Smoking status: Former    Packs/day: 1.00    Years: 20.00    Total pack years: 20.00    Types: Cigarettes    Quit date: 06/10/2011    Years since quitting: 10.4   Smokeless tobacco: Current   Tobacco comments:    Uses vape   Vaping Use   Vaping Use: Every day  Substance and Sexual Activity   Alcohol use: Yes    Comment: twice a month-social   Drug use: No   Sexual activity: Not on file  Other Topics Concern   Not on file  Social History Narrative   Married, 3 children.  Lives in [place].   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   Tob: 20 pack-yr hx, quit 2013.   Alc: rare.   No drugs.      Social Determinants of Health   Financial Resource Strain: Not on file  Food Insecurity: Not on file  Transportation Needs: Not on file  Physical Activity: Not on file  Stress: Not on file  Social Connections: Not on file  Intimate Partner Violence: Not on file    Physical Exam: Vital signs in last 24 hours: @BP  120/72   Pulse 75   Temp 98.4 F (36.9 C)   Ht 5' 4"  (1.626 m)   Wt 181 lb (82.1 kg)   LMP 10/12/2021   SpO2 99%   BMI 31.07 kg/m  GEN: NAD EYE: Sclerae anicteric ENT: MMM CV: Non-tachycardic Pulm: CTA b/l GI: Soft, NT/ND NEURO:  Alert &  Oriented x 3   Zenovia Jarred, MD [facility]  11/18/2021 2:47 PM

## 2021-11-19 ENCOUNTER — Telehealth: Payer: Self-pay | Admitting: *Deleted

## 2021-11-19 NOTE — Telephone Encounter (Signed)
  Follow up Call-     11/18/2021    2:17 PM 10/15/2020    7:13 AM  Call back number  Post procedure Call Back phone  # [phone]  Permission to leave phone message Yes Yes     Patient questions:  Do you have a fever, pain , or abdominal swelling? No. Pain Score  0 *  Have you tolerated food without any problems? Yes.    Have you been able to return to your normal activities? Yes.    Do you have any questions about your discharge instructions: Diet   No. Medications  No. Follow up visit  No.  Do you have questions or concerns about your Care? No.  Actions: * If pain score is 4 or above: No action needed, pain <4.

## 2021-11-29 ENCOUNTER — Encounter: Payer: Self-pay | Admitting: Internal Medicine

## 2021-11-29 ENCOUNTER — Encounter: Payer: Self-pay | Admitting: Family Medicine

## 2021-11-29 ENCOUNTER — Ambulatory Visit (INDEPENDENT_AMBULATORY_CARE_PROVIDER_SITE_OTHER): Payer: BC Managed Care – PPO | Admitting: Family Medicine

## 2021-11-29 VITALS — BP 109/69 | HR 59 | Temp 98.2°F | Ht 64.0 in | Wt 179.4 lb

## 2021-11-29 DIAGNOSIS — R7303 Prediabetes: Secondary | ICD-10-CM

## 2021-11-29 DIAGNOSIS — Z7689 Persons encountering health services in other specified circumstances: Secondary | ICD-10-CM

## 2021-11-29 DIAGNOSIS — D509 Iron deficiency anemia, unspecified: Secondary | ICD-10-CM | POA: Diagnosis not present

## 2021-11-30 LAB — CBC
HCT: 38.9 % (ref 35.0–45.0)
Hemoglobin: 12.7 g/dL (ref 11.7–15.5)
MCH: 27.8 pg (ref 27.0–33.0)
MCHC: 32.6 g/dL (ref 32.0–36.0)
MCV: 85.1 fL (ref 80.0–100.0)
MPV: 9.4 fL (ref 7.5–12.5)
Platelets: 447 10*3/uL — ABNORMAL HIGH (ref 140–400)
RBC: 4.57 10*6/uL (ref 3.80–5.10)
RDW: 14.8 % (ref 11.0–15.0)
WBC: 8.5 10*3/uL (ref 3.8–10.8)

## 2021-11-30 LAB — IRON,TIBC AND FERRITIN PANEL
%SAT: 10 % (calc) — ABNORMAL LOW (ref 16–45)
Ferritin: 13 ng/mL — ABNORMAL LOW (ref 16–232)
Iron: 47 ug/dL (ref 45–160)
TIBC: 449 mcg/dL (calc) (ref 250–450)

## 2021-11-30 LAB — HEMOGLOBIN A1C
Hgb A1c MFr Bld: 5.3 % of total Hgb (ref <5.7)
Mean Plasma Glucose: 105 mg/dL
eAG (mmol/L): 5.8 mmol/L

## 2021-12-27 ENCOUNTER — Other Ambulatory Visit: Payer: Self-pay | Admitting: Family Medicine

## 2022-01-26 ENCOUNTER — Other Ambulatory Visit: Payer: Self-pay | Admitting: Family Medicine

## 2022-01-27 MED ORDER — OZEMPIC (1 MG/DOSE) 4 MG/3ML ~~LOC~~ SOPN
1.0000 mg | PEN_INJECTOR | SUBCUTANEOUS | 0 refills | Status: DC
Start: 2022-01-27 — End: 2022-02-05

## 2022-01-30 ENCOUNTER — Encounter: Payer: Self-pay | Admitting: Family Medicine

## 2022-01-31 MED ORDER — TEMAZEPAM 15 MG PO CAPS: ORAL_CAPSULE | ORAL | 0 refills | Status: DC

## 2022-01-31 NOTE — Telephone Encounter (Signed)
Requesting: Restoril Contract: 07/30/21 UDS: 01/10/22 Last Visit: 11/29/21 Next Visit: 03/03/22 Last Refill: 07/30/21(90,1)  Please Advise. Medication pending

## 2022-02-05 ENCOUNTER — Telehealth: Payer: Self-pay | Admitting: Family Medicine

## 2022-02-05 MED ORDER — WEGOVY 1 MG/0.5ML ~~LOC~~ SOAJ
1.0000 mg | SUBCUTANEOUS | 5 refills | Status: DC
Start: 2022-02-05 — End: 2022-02-11

## 2022-02-05 NOTE — Telephone Encounter (Signed)
Pt called stating [facility] will not fill her Ozem[facility] due to needing an prior Auth from her insurance. Please let the patient know when this is done.

## 2022-02-05 NOTE — Telephone Encounter (Signed)
Patient advised rx sent

## 2022-02-05 NOTE — Telephone Encounter (Signed)
Please review and advise.

## 2022-02-05 NOTE — Telephone Encounter (Signed)
Pt heard back and the Ozempic was denied due to not being type 2 diabetic. She reports that they will however approve Wegovy for the pre diabetes and weight loss.

## 2022-02-05 NOTE — Telephone Encounter (Signed)
[facility] prescription sent to [facility].

## 2022-02-05 NOTE — Telephone Encounter (Signed)
LM for pt to returncall

## 2022-02-05 NOTE — Telephone Encounter (Signed)
Please see previous message thread.  Close this thread of messages.  Duplicated messages beginning.

## 2022-02-05 NOTE — Telephone Encounter (Signed)
Patient returning call.  I told patient PA in process. No call back to patient is needed at this time.

## 2022-02-05 NOTE — Telephone Encounter (Signed)
Gabriella Stephens (Key: [id number]) Rx #: [phone] Ozempic (1 MG/DOSE) '4MG'$ Gabriella Stephens pen-injectors  PA in process, waiting for insurance determination.

## 2022-02-11 ENCOUNTER — Telehealth: Payer: Self-pay | Admitting: Family Medicine

## 2022-02-11 MED ORDER — WEGOVY 1.7 MG/0.75ML ~~LOC~~ SOAJ: 1.7000 mg | SUBCUTANEOUS | 5 refills | Status: DC

## 2022-02-11 NOTE — Telephone Encounter (Signed)
Venita Sheffield (Key: [id number]) Rx #: [id number] [phone] 1.'7MG'$ /0.75ML auto-injectors   Form [employer] Electronic Prior Authorization Form (2017 NCPDP)  PA completed 9/5, waiting for insurance determination.

## 2022-02-11 NOTE — Telephone Encounter (Signed)
Status: PA Response - Approved as of 9/5  LM for patient regarding update

## 2022-02-11 NOTE — Telephone Encounter (Signed)
Please review and advise.

## 2022-02-11 NOTE — Telephone Encounter (Signed)
Okay new prescription sent for 1.7 mg dosing.

## 2022-02-11 NOTE — Telephone Encounter (Signed)
Pt's daughter Jarrett Soho ) called and mentioned that the rx for [facility] is no longer manufacturing the 1% until early next year due to building a new facility. She asked that Dr. Anitra Lauth revise the rx to 1.7 mg.  If any additional information is needed please reach out to the pt.

## 2022-02-11 NOTE — Telephone Encounter (Signed)
Patient made aware PA is process

## 2022-03-03 ENCOUNTER — Ambulatory Visit (INDEPENDENT_AMBULATORY_CARE_PROVIDER_SITE_OTHER): Payer: BC Managed Care – PPO | Admitting: Family Medicine

## 2022-03-03 ENCOUNTER — Encounter: Payer: Self-pay | Admitting: Family Medicine

## 2022-03-03 VITALS — BP 114/72 | HR 64 | Temp 98.1°F | Ht 64.0 in | Wt 165.4 lb

## 2022-03-03 DIAGNOSIS — Z7689 Persons encountering health services in other specified circumstances: Secondary | ICD-10-CM

## 2022-03-03 DIAGNOSIS — D509 Iron deficiency anemia, unspecified: Secondary | ICD-10-CM

## 2022-03-03 DIAGNOSIS — E538 Deficiency of other specified B group vitamins: Secondary | ICD-10-CM | POA: Diagnosis not present

## 2022-03-03 DIAGNOSIS — E669 Obesity, unspecified: Secondary | ICD-10-CM

## 2022-03-03 MED ORDER — SERTRALINE HCL 100 MG PO TABS
100.0000 mg | ORAL_TABLET | Freq: Every day | ORAL | 3 refills | Status: DC
Start: 2022-03-03 — End: 2023-03-04

## 2022-03-03 NOTE — Progress Notes (Signed)
OFFICE VISIT  03/03/2022  CC:  Chief Complaint  Patient presents with   Follow-up    Iron deficiency, weight     HPI:    Patient is a 50 y.o. female who presents for 69-monthfollow-up obesity/weight management and iron deficiency anemia. A/P as of last visit: "#1 weight management encounter, obesity class II Comorbidities:  prediabetes, hyperlipidemia. Has had about 30 pounds of weight loss on Ozempic x4 months. We discussed the fact that she is likely reached the peak of effect of Ozempic and her weight loss will not progress until she starts exercising.   Recheck hemoglobin A1c today.   #2 iron deficiency anemia. Due to combination of excessive blood loss with menses as well as frequent blood donations. Has been on iron supplement for the last 4 months. Recheck iron and CBC today."  INTERIM HX: She feels well. The Wegovy 1.7 mg weekly dose does not cause her any nausea. She has started exercising regularly again.  Last visit her hemoglobin and iron had improved just a little bit and I kept her on oral iron supplement. This is going fine.  She is not taking any B12 supplement.  Past Medical History:  Diagnosis Date   Abnormal mammogram 09/2020   L breast, ? complicated cysts?-->bx 42/40/97showed fibroadenomas x 2-->resume annual mammogram screening.   Anxiety and depression    Eustachian tube dysfunction, right 10/2017   Persistent (6 wks)-->failed nasal steroid, decongestant, and systemic steroid trial.  ENT to do tympanostomy tube as of 11/10/17.   Hepatic hemangioma    small, right side: seen on u/s 2013, no change on CT 2018.   History of adenomatous polyp of colon 10/15/2020   many->rpt 6 mo recommended   Hypercholesterolemia 05/2019   Mild->TLC   Iron deficiency anemia 07/2021   Obesity, Class II, BMI 35-39.9    Palpitations 10/2017   48 H Holter with some PAC's and PVCs, brief run of atrial ectopy.  No afib.   Prediabetes    Subclinical hypothyroidism  05/2019   TSH around 5. Plan rechk labs 6 mo.   Vitamin B12 deficiency 07/2021    Past Surgical History:  Procedure Laterality Date   421HR HOLTER  10/2017   48 H Holter with some PAC's and PVCs, brief run of atrial ectopy.  No afib.   BREAST BIOPSY Left 09/24/2020   Fibroadenoma   BUNIONECTOMY     right foot   CESAREAN SECTION  x 2   1995; 2000   COLONOSCOPY     COLONOSCOPY W/ POLYPECTOMY  10/15/2020   +adenomas->rpt 6 mo recommended   POLYPECTOMY     TONSILLECTOMY  1973   as a child   TUBAL LIGATION  2001   UPPER GASTROINTESTINAL ENDOSCOPY  2013   for epigastric pain. Mild antral gastritis on visualization, bx/ h pylori neg    Outpatient Medications Prior to Visit  Medication Sig Dispense Refill   Cyanocobalamin (VITAMIN B 12 PO) Take by mouth.     ferrous sulfate 325 (65 FE) MG EC tablet Take 325 mg by mouth 3 (three) times daily with meals.     FIBER ADULT GUMMIES PO Take by mouth.     Semaglutide-Weight Management (WEGOVY) 1.7 MG/0.75ML SOAJ Inject 1.7 mg into the skin once a week. 3 mL 5   temazepam (RESTORIL) 15 MG capsule 1 tab po qhs as needed for sleep 90 capsule 0   sertraline (ZOLOFT) 100 MG tablet Take 1 tablet (100 mg total) by mouth  daily. 90 tablet 1   No facility-administered medications prior to visit.    Allergies  Allergen Reactions   Penicillins Hives    ROS As per HPI  PE:    03/03/2022    2:24 PM 11/29/2021    3:32 PM 11/18/2021    3:40 PM  Vitals with BMI  Height '5\' 4"'$  '5\' 4"'$    Weight 165 lbs 6 oz 179 lbs 6 oz   BMI 24.40 10.27   Systolic 253 664 403  Diastolic 72 69 72  Pulse 64 59 65   Physical Exam  Gen: Alert, well appearing.  Patient is oriented to person, place, time, and situation.. AFFECT: pleasant, lucid thought and speech. No further exam today.  LABS:  Last CBC Lab Results  Component Value Date   WBC 8.5 11/29/2021   HGB 12.7 11/29/2021   HCT 38.9 11/29/2021   MCV 85.1 11/29/2021   MCH 27.8 11/29/2021   RDW 14.8  11/29/2021   PLT 447 (H) 11/29/2021   Lab Results  Component Value Date   IRON 47 11/29/2021   TIBC 449 11/29/2021   FERRITIN 13 (L) 47/42/5956   Last metabolic panel Lab Results  Component Value Date   GLUCOSE 88 07/30/2021   NA 136 07/30/2021   K 4.2 07/30/2021   CL 101 07/30/2021   CO2 28 07/30/2021   BUN 15 07/30/2021   CREATININE 0.94 07/30/2021   GFRNONAA >60 12/20/2016   CALCIUM 9.3 07/30/2021   PROT 7.4 07/30/2021   ALBUMIN 4.4 07/30/2021   BILITOT 0.4 07/30/2021   ALKPHOS 63 07/30/2021   AST 16 07/30/2021   ALT 12 07/30/2021   ANIONGAP 10 12/20/2016   Last lipids Lab Results  Component Value Date   CHOL 247 (H) 07/30/2021   HDL 63.30 07/30/2021   LDLCALC 160 (H) 07/30/2021   TRIG 118.0 07/30/2021   CHOLHDL 4 07/30/2021   Last hemoglobin A1c Lab Results  Component Value Date   HGBA1C 5.3 11/29/2021   Last thyroid functions Lab Results  Component Value Date   TSH 3.33 07/30/2021   T3TOTAL 118 05/16/2020   Last vitamin B12 and Folate Lab Results  Component Value Date   VITAMINB12 209 (L) 07/31/2021   IMPRESSION AND PLAN:  weight management encounter, obesity class 1--Comorbidities:  prediabetes, hyperlipidemia. Has had about 40 pounds of weight loss on Ozempic/wegovy x7 months Continue 1.7 mg subcu weekly.  #2 iron deficiency anemia: Due to combination of excessive blood loss with menses as well as frequent blood donations. Has been on iron supplement for the last 7 months. Recheck iron and CBC today.  3 vitamin B12 deficiency. Get started on B12 1000 mcg sublingual tab once daily. We will recheck B12 level at next follow-up in 6 months.  An After Visit Summary was printed and given to the patient.  FOLLOW UP: Return in about 6 months (around 09/01/2022) for annual CPE (fasting).  Signed:  Crissie Sickles, MD           03/03/2022

## 2022-03-03 NOTE — Patient Instructions (Signed)
Take 1000 microgram sublingual vitamin B12 tab daily

## 2022-03-04 LAB — IRON,TIBC AND FERRITIN PANEL
%SAT: 26 % (calc) (ref 16–45)
Ferritin: 21 ng/mL (ref 16–232)
Iron: 92 ug/dL (ref 45–160)
TIBC: 353 mcg/dL (calc) (ref 250–450)

## 2022-03-04 LAB — CBC
HCT: 39.4 % (ref 36.0–46.0)
Hemoglobin: 13.4 g/dL (ref 12.0–15.0)
MCHC: 33.9 g/dL (ref 30.0–36.0)
MCV: 91.2 fl (ref 78.0–100.0)
Platelets: 356 10*3/uL (ref 150.0–400.0)
RBC: 4.32 Mil/uL (ref 3.87–5.11)
RDW: 16.8 % — ABNORMAL HIGH (ref 11.5–15.5)
WBC: 6.1 10*3/uL (ref 4.0–10.5)

## 2022-03-05 ENCOUNTER — Other Ambulatory Visit: Payer: Self-pay

## 2022-03-05 ENCOUNTER — Encounter: Payer: Self-pay | Admitting: Family Medicine

## 2022-03-05 MED ORDER — SCOPOLAMINE 1 MG/3DAYS TD PT72: 1.0000 | MEDICATED_PATCH | TRANSDERMAL | 1 refills | Status: DC

## 2022-03-05 NOTE — Telephone Encounter (Signed)
Pt requested seasick patches yesterday for a trip coming at the end of next month.

## 2022-03-18 ENCOUNTER — Encounter: Payer: Self-pay | Admitting: Family Medicine

## 2022-03-25 NOTE — Telephone Encounter (Signed)
Gabriella Stephens, I am sorry I have not gotten back with you about this.  Unfortunately, I can't do this. It is most appropriately and safely addressed by your gynecologist, Dr. Stann Mainland.   He will determine the best way to address your bleeding.  He may want to do a pelvic exam, pelvic ultrasound, and endometrial biopsy. It would not be in your best interest for me to give you high doses of hormones before this is appropriately evaluated. -PM

## 2022-05-03 ENCOUNTER — Other Ambulatory Visit: Payer: Self-pay | Admitting: Family Medicine

## 2022-05-05 MED ORDER — TEMAZEPAM 15 MG PO CAPS: ORAL_CAPSULE | ORAL | 1 refills | Status: DC

## 2022-05-05 NOTE — Telephone Encounter (Signed)
Requesting: Restoril Contract: 07/30/21 UDS: 01/10/22 Last Visit:  03/03/22 Next Visit: 09/01/22 Last Refill: 07/30/21(90,1)   Please Advise. Medication pending

## 2022-06-07 IMAGING — US US BREAST*L* LIMITED INC AXILLA
1 series · 13 of 22 positions shown · non-contrast
Comparison: Previous exam(s).

CLINICAL DATA: Recall from screening mammography, possible mass
involving the UPPER OUTER QUADRANT of the LEFT breast at POSTERIOR
depth.

EXAM:
DIGITAL DIAGNOSTIC UNILATERAL LEFT MAMMOGRAM WITH TOMOSYNTHESIS AND
CAD; ULTRASOUND LEFT BREAST LIMITED
TECHNIQUE: Left digital diagnostic mammography and breast tomosynthesis was
performed. The images were evaluated with computer-aided detection.;
Targeted ultrasound examination of the left breast was performed

[Series 1: us breast*left* limited inc axilla · 0.07mm/px · 13 of 22 slices shown]
[im 1/22]
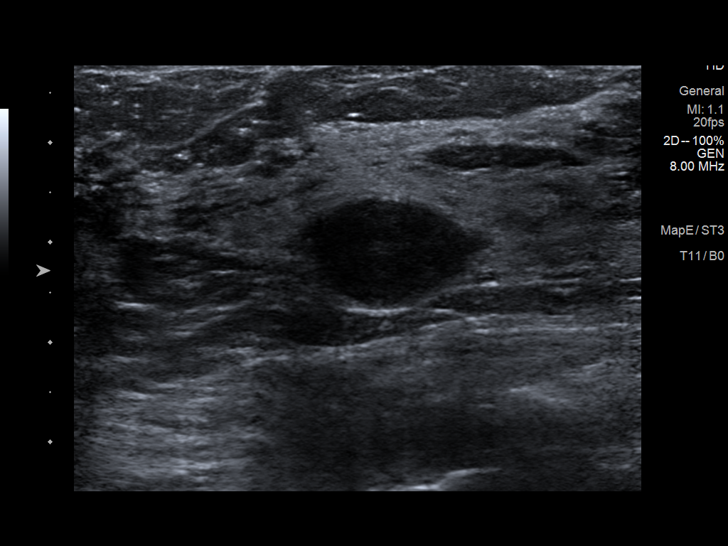
[im 3/22]
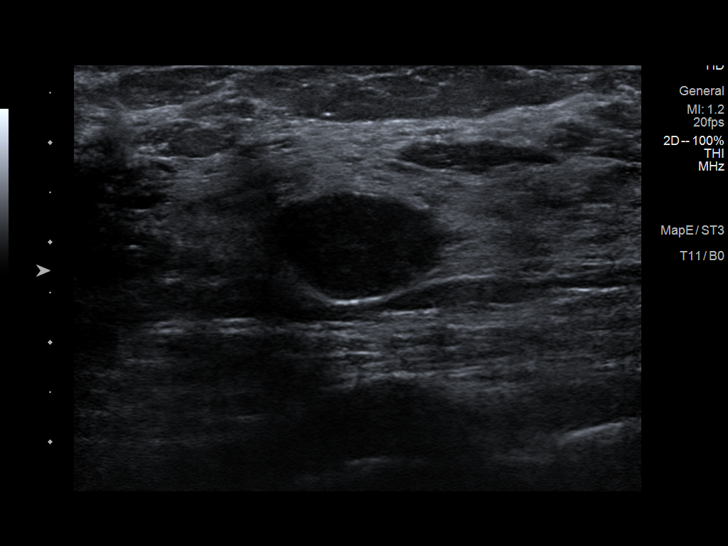
[im 5/22]
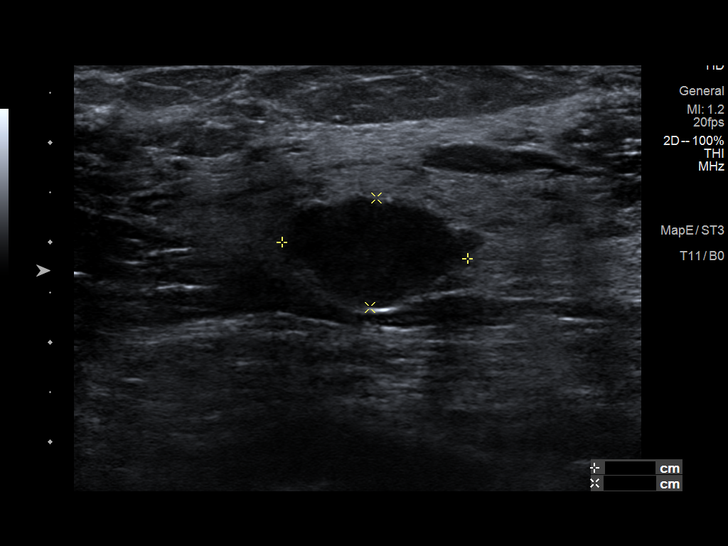
[im 6/22]
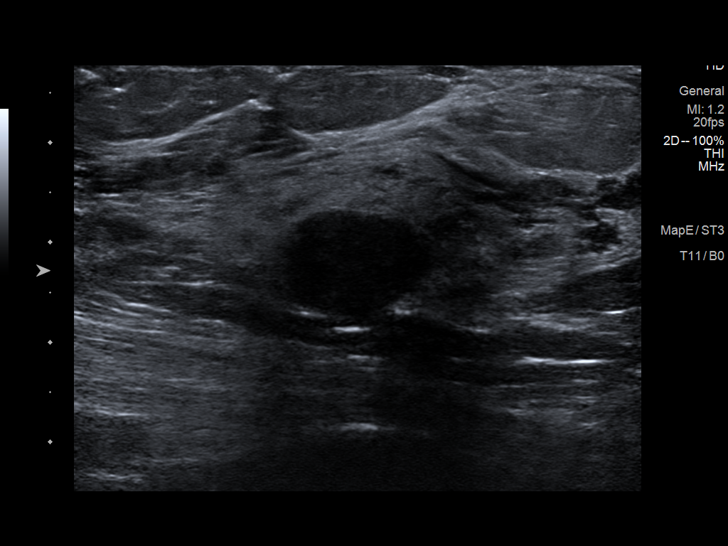
[im 8/22]
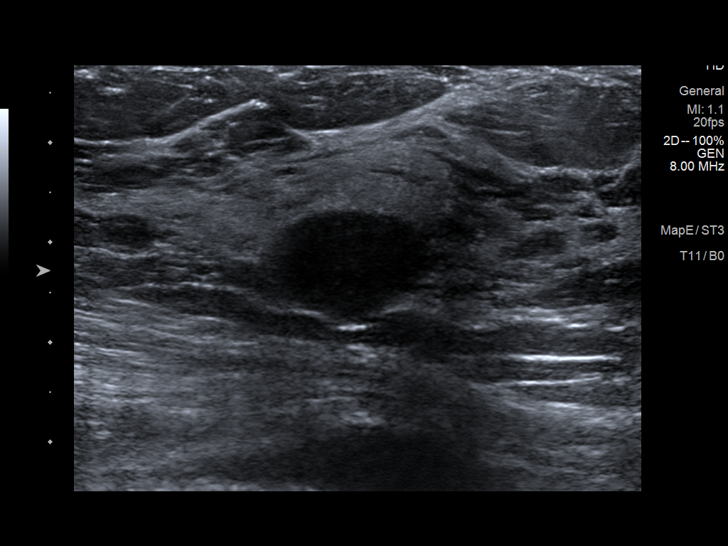
[im 10/22]
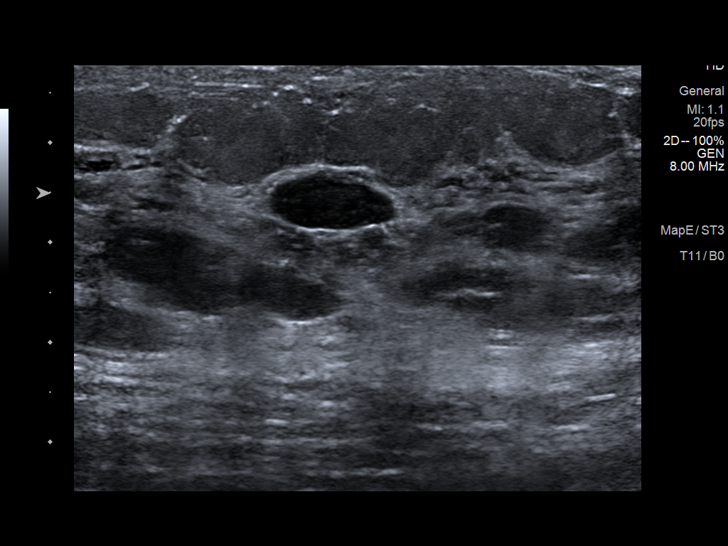
[im 12/22]
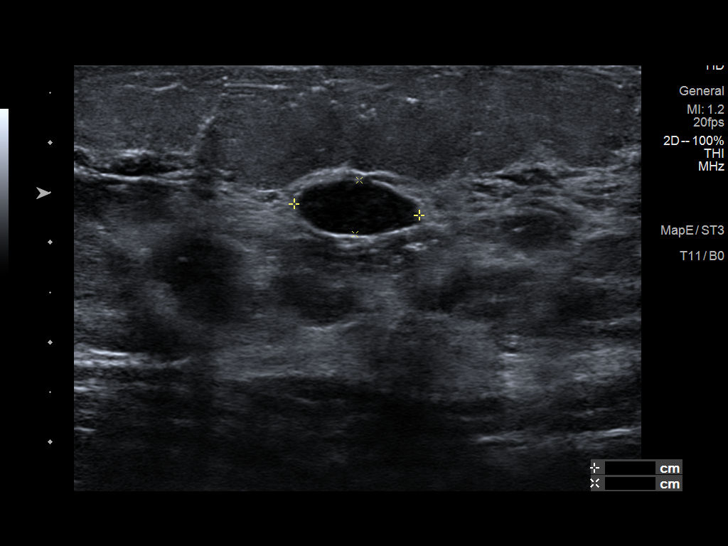
[im 13/22]
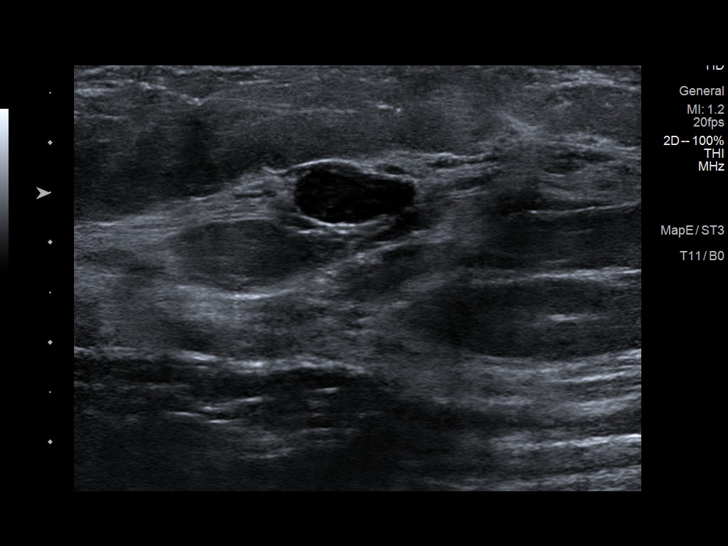
[im 15/22]
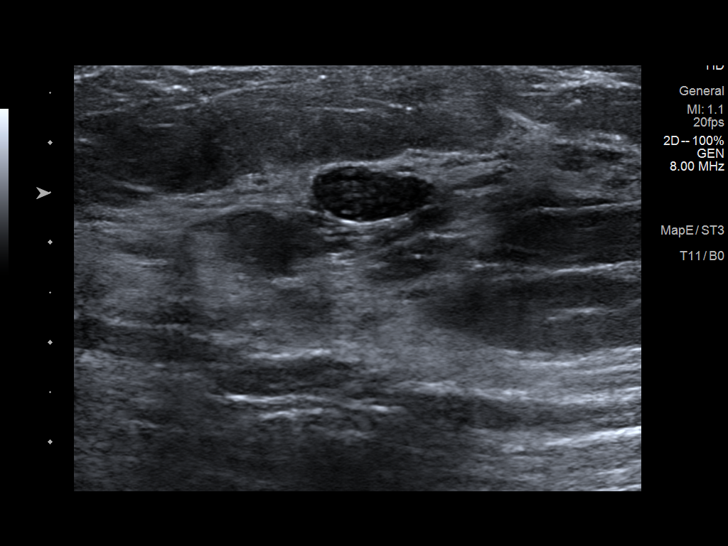
[im 17/22]
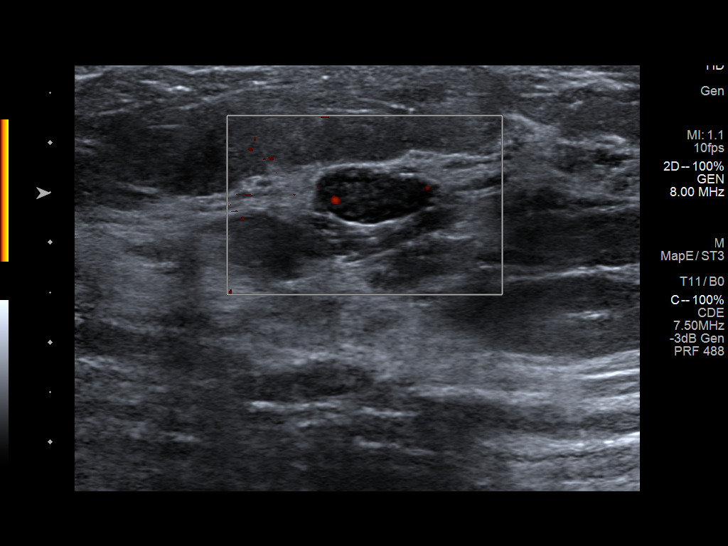
[im 18/22]
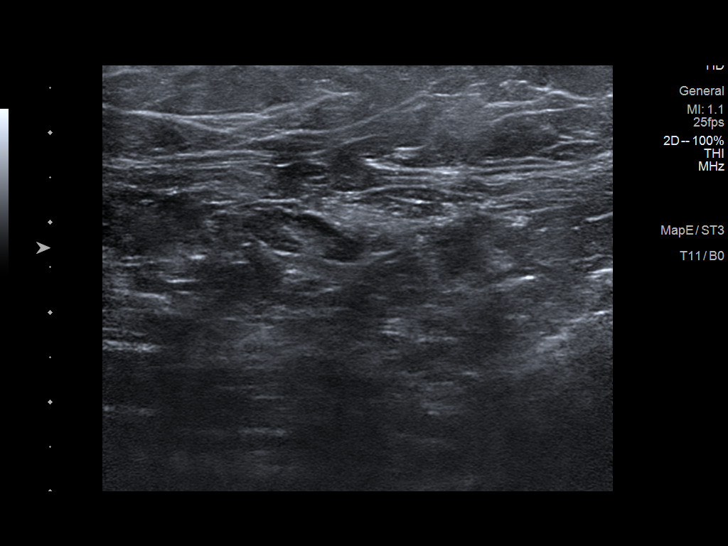
[im 20/22]
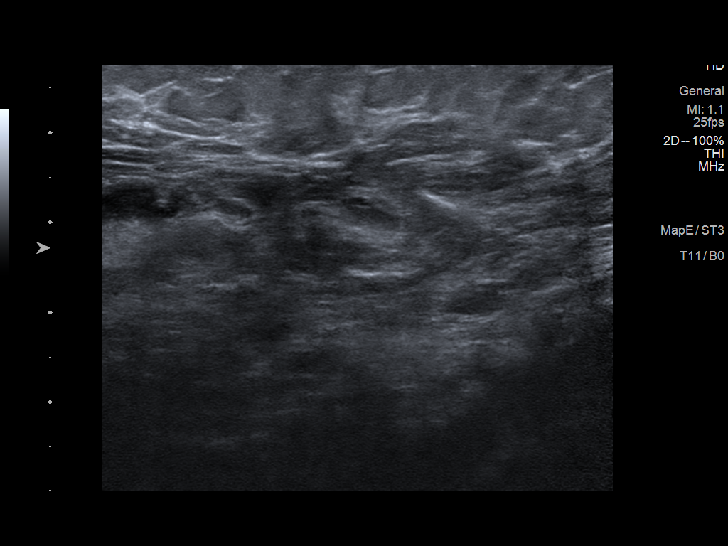
[im 22/22]
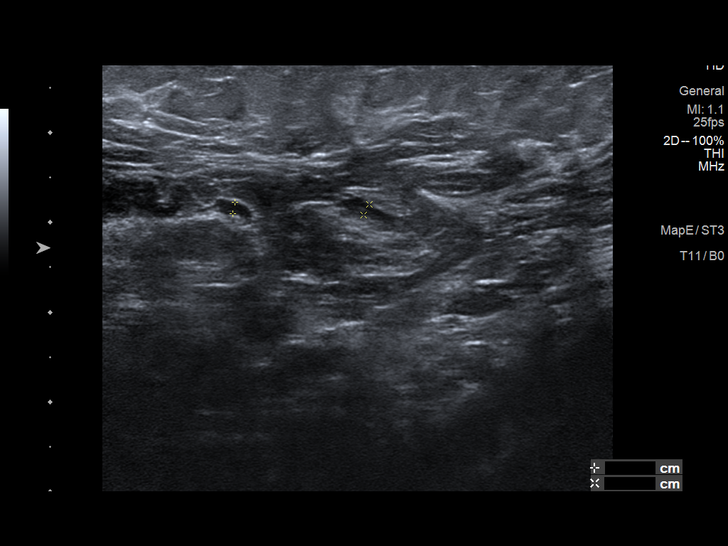

[13 of 22 positions shown; findings below may reference images not displayed]

ACR Breast Density Category c: The breast tissue is heterogeneously
dense, which may obscure small masses.
FINDINGS: Spot-compression CC and MLO views of the area of concern were
obtained. These confirm a circumscribed isodense mass measuring
approximately 1.5 cm without associated architectural distortion or
suspicious calcifications.

Targeted ultrasound is performed, demonstrating an oval parallel
hypoechoic mass with lobulated margins at the 2 o'clock position
approximately 8 cm from the nipple at posterior depth, measuring
approximately 1.9 x 1.1 x 1.6 cm, demonstrating slight posterior
acoustic enhancement and no internal power Doppler flow,
corresponding to the screening mammographic finding.

At the 2 o'clock position approximately 6 cm from the nipple at
middle depth is an oval parallel circumscribed hypoechoic mass
measuring approximately 1.3 x 0.5 x 1.3 cm, demonstrating posterior
acoustic enhancement and demonstrating peripheral power Doppler
flow.

Sonographic evaluation of the LEFT axilla demonstrates no pathologic
lymphadenopathy.
IMPRESSION: 1. Indeterminate though likely benign 1.9 cm mass involving the
UPPER OUTER QUADRANT of the LEFT breast at posterior depth at the 2
o'clock position approximately 8 cm from nipple, accounting for the
screening mammographic finding. This may represent a complicated
cyst.
2. Indeterminate though likely benign 1.3 cm mass involving the
UPPER OUTER QUADRANT of the LEFT breast at middle depth at the 2
o'clock position approximately 6 cm from nipple. This may also
represent a complicated cyst or possibly a fibroadenoma.
3. No pathologic LEFT axillary lymphadenopathy.

RECOMMENDATION:
Ultrasound-guided aspiration of the masses in the UPPER OUTER
QUADRANT of the LEFT breast. If the masses are solid and are not
complicated cysts, core needle biopsy can be performed at that time.

The aspiration and core needle biopsy procedures were discussed with
the patient and her questions were answered. She wishes to proceed
and the biopsy has been scheduled by the [REDACTED] staff.

I have discussed the findings and recommendations with the patient.

BI-RADS CATEGORY  4: Suspicious.

## 2022-06-16 ENCOUNTER — Encounter: Payer: Self-pay | Admitting: Family Medicine

## 2022-06-16 NOTE — Telephone Encounter (Signed)
Please advise if ok for increase

## 2022-06-17 MED ORDER — WEGOVY 2.4 MG/0.75ML ~~LOC~~ SOAJ: 2.4000 mg | SUBCUTANEOUS | 3 refills | Status: DC

## 2022-08-08 HISTORY — PX: KNEE SURGERY: SHX244

## 2022-09-01 ENCOUNTER — Encounter: Payer: Self-pay | Admitting: Family Medicine

## 2022-09-01 ENCOUNTER — Ambulatory Visit (INDEPENDENT_AMBULATORY_CARE_PROVIDER_SITE_OTHER): Payer: BC Managed Care – PPO | Admitting: Family Medicine

## 2022-09-01 VITALS — BP 108/74 | HR 64 | Temp 97.6°F | Ht 63.39 in | Wt 157.6 lb

## 2022-09-01 DIAGNOSIS — F5101 Primary insomnia: Secondary | ICD-10-CM

## 2022-09-01 DIAGNOSIS — E038 Other specified hypothyroidism: Secondary | ICD-10-CM | POA: Diagnosis not present

## 2022-09-01 DIAGNOSIS — Z0000 Encounter for general adult medical examination without abnormal findings: Secondary | ICD-10-CM

## 2022-09-01 DIAGNOSIS — R7303 Prediabetes: Secondary | ICD-10-CM | POA: Diagnosis not present

## 2022-09-01 DIAGNOSIS — E538 Deficiency of other specified B group vitamins: Secondary | ICD-10-CM

## 2022-09-01 DIAGNOSIS — Z79899 Other long term (current) drug therapy: Secondary | ICD-10-CM

## 2022-09-01 DIAGNOSIS — Z7689 Persons encountering health services in other specified circumstances: Secondary | ICD-10-CM

## 2022-09-01 DIAGNOSIS — D509 Iron deficiency anemia, unspecified: Secondary | ICD-10-CM

## 2022-09-01 DIAGNOSIS — E669 Obesity, unspecified: Secondary | ICD-10-CM

## 2022-09-01 LAB — COMPREHENSIVE METABOLIC PANEL
ALT: 11 U/L (ref 0–35)
AST: 12 U/L (ref 0–37)
Albumin: 4.4 g/dL (ref 3.5–5.2)
Alkaline Phosphatase: 41 U/L (ref 39–117)
BUN: 15 mg/dL (ref 6–23)
CO2: 26 mEq/L (ref 19–32)
Calcium: 9.4 mg/dL (ref 8.4–10.5)
Chloride: 103 mEq/L (ref 96–112)
Creatinine, Ser: 0.97 mg/dL (ref 0.40–1.20)
GFR: 67.22 mL/min (ref >60.00)
Glucose, Bld: 84 mg/dL (ref 70–99)
Potassium: 4.4 mEq/L (ref 3.5–5.1)
Sodium: 135 mEq/L (ref 135–145)
Total Bilirubin: 0.6 mg/dL (ref 0.2–1.2)
Total Protein: 7 g/dL (ref 6.0–8.3)

## 2022-09-01 LAB — CBC WITH DIFFERENTIAL/PLATELET
Basophils Absolute: 0.1 10*3/uL (ref 0.0–0.1)
Basophils Relative: 1.2 % (ref 0.0–3.0)
Eosinophils Absolute: 0.1 10*3/uL (ref 0.0–0.7)
Eosinophils Relative: 1 % (ref 0.0–5.0)
HCT: 43.3 % (ref 36.0–46.0)
Hemoglobin: 14.7 g/dL (ref 12.0–15.0)
Lymphocytes Relative: 36.8 % (ref 12.0–46.0)
Lymphs Abs: 2 10*3/uL (ref 0.7–4.0)
MCHC: 34 g/dL (ref 30.0–36.0)
MCV: 93 fl (ref 78.0–100.0)
Monocytes Absolute: 0.5 10*3/uL (ref 0.1–1.0)
Monocytes Relative: 8.4 % (ref 3.0–12.0)
Neutro Abs: 2.9 10*3/uL (ref 1.4–7.7)
Neutrophils Relative %: 52.6 % (ref 43.0–77.0)
Platelets: 367 10*3/uL (ref 150.0–400.0)
RBC: 4.65 Mil/uL (ref 3.87–5.11)
RDW: 14 % (ref 11.5–15.5)
WBC: 5.5 10*3/uL (ref 4.0–10.5)

## 2022-09-01 LAB — LIPID PANEL
Cholesterol: 239 mg/dL — ABNORMAL HIGH (ref 0–200)
HDL: 66.6 mg/dL (ref >39.00)
LDL Cholesterol: 158 mg/dL — ABNORMAL HIGH (ref 0–99)
NonHDL: 172.52
Total CHOL/HDL Ratio: 4
Triglycerides: 75 mg/dL (ref 0.0–149.0)
VLDL: 15 mg/dL (ref 0.0–40.0)

## 2022-09-01 LAB — HEMOGLOBIN A1C: Hgb A1c MFr Bld: 5.3 % (ref 4.6–6.5)

## 2022-09-01 MED ORDER — TEMAZEPAM 15 MG PO CAPS: ORAL_CAPSULE | ORAL | 1 refills | Status: DC

## 2022-09-01 NOTE — Progress Notes (Signed)
Office Note 09/01/2022  CC:  Chief Complaint  Patient presents with   Annual Exam    Pt is fasting   Patient is a 52 y.o. female who is here for annual health maintenance exam and 59-month follow-up weight management and iron deficiency anemia. A/P as of last visit: "weight management encounter, obesity class 1--Comorbidities:  prediabetes, hyperlipidemia. Has had about 40 pounds of weight loss on Ozempic/wegovy x7 months Continue 1.7 mg subcu weekly.   #2 iron deficiency anemia: Due to combination of excessive blood loss with menses as well as frequent blood donations. Has been on iron supplement for the last 7 months. Recheck iron and CBC today.   3 vitamin B12 deficiency. Get started on B12 1000 mcg sublingual tab once daily. We will recheck B12 level at next follow-up in 6 months."  INTERIM HX: Feeling well other than some chronic right hip pain. She got right knee surgery in February of this year.  Arthroscopic, meniscal repair. Rehab for her knee went well but ongoing hip pain has prevented exercise lately. Diet is great, she continues to gradually lose weight on Wegovy.   PMP AWARE reviewed today: most recent rx for temazepam was filled 08/04/2022, # 33, rx by me. No red flags.  Past Medical History:  Diagnosis Date   Abnormal mammogram 09/2020   L breast, ? complicated cysts?-->bx [id number] showed fibroadenomas x 2-->resume annual mammogram screening.   Anxiety and depression    Eustachian tube dysfunction, right 10/2017   Persistent (6 wks)-->failed nasal steroid, decongestant, and systemic steroid trial.  ENT to do tympanostomy tube as of 11/10/17.   Hepatic hemangioma    small, right side: seen on u/s 2013, no change on CT 2018.   History of adenomatous polyp of colon 10/15/2020   many->rpt 6 mo recommended   Hypercholesterolemia 05/2019   Mild->TLC   Iron deficiency anemia 07/2021   Suspect due to menorrhagia   Menorrhagia    IDA 2023   Obesity, Class II,  BMI 35-39.9    Palpitations 10/2017   48 H Holter with some PAC's and PVCs, brief run of atrial ectopy.  No afib.   Prediabetes    Subclinical hypothyroidism 05/2019   TSH around 5. Plan rechk labs 6 mo.   Vitamin B12 deficiency 07/2021    Past Surgical History:  Procedure Laterality Date   11 HR HOLTER  10/2017   48 H Holter with some PAC's and PVCs, brief run of atrial ectopy.  No afib.   BREAST BIOPSY Left 09/24/2020   Fibroadenoma   BUNIONECTOMY     right foot   CESAREAN SECTION  x 2   1995; 2000   COLONOSCOPY     11/2021 adenoma x 2   COLONOSCOPY W/ POLYPECTOMY  10/15/2020   +adenomas->rpt 6 mo recommended   POLYPECTOMY     TONSILLECTOMY  1973   as a child   TUBAL LIGATION  2001   UPPER GASTROINTESTINAL ENDOSCOPY  2013   for epigastric pain. Mild antral gastritis on visualization, bx/ h pylori neg    Family History  Problem Relation Age of Onset   Thyroid disease Mother    Hypertension Mother    Diabetes Mother    Heart disease Mother    Hyperlipidemia Mother    Breast cancer Mother    Hypertension Father    Hyperlipidemia Father    Multiple myeloma Father    Colon cancer Paternal Grandfather    Kidney disease Neg Hx    Asthma Neg  Hx    Stroke Neg Hx    Esophageal cancer Neg Hx    Rectal cancer Neg Hx    Stomach cancer Neg Hx    Colon polyps Neg Hx     Social History   Socioeconomic History   Marital status: Married    Spouse name: Not on file   Number of children: 3   Years of education: Not on file   Highest education level: Not on file  Occupational History   Occupation: Best boy: [employer]    Employer: [employer]  Tobacco Use   Smoking status: Former    Packs/day: 1.00    Years: 20.00    Additional pack years: 0.00    Total pack years: 20.00    Types: Cigarettes    Quit date: 06/10/2011    Years since quitting: 11.2   Smokeless tobacco: Current   Tobacco comments:    Uses vape   Vaping Use   Vaping Use: Every day  Substance and  Sexual Activity   Alcohol use: Yes    Comment: twice a month-social   Drug use: No   Sexual activity: Not on file  Other Topics Concern   Not on file  Social History Narrative   Married, 3 children.  Lives in [place].   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   Tob: 20 pack-yr hx, quit 2013.   Alc: rare.   No drugs.      Social Determinants of Health   Financial Resource Strain: Not on file  Food Insecurity: Not on file  Transportation Needs: Not on file  Physical Activity: Not on file  Stress: Not on file  Social Connections: Not on file  Intimate Partner Violence: Not on file    Outpatient Medications Prior to Visit  Medication Sig Dispense Refill   Docusate Calcium (STOOL SOFTENER PO) Take by mouth daily.     ferrous sulfate 325 (65 FE) MG EC tablet Take 325 mg by mouth 3 (three) times daily with meals.     Semaglutide-Weight Management (WEGOVY) 2.4 MG/0.75ML SOAJ Inject 2.4 mg into the skin once a week. 3 mL 3   sertraline (ZOLOFT) 100 MG tablet Take 1 tablet (100 mg total) by mouth daily. 90 tablet 3   temazepam (RESTORIL) 15 MG capsule 1 tab po qhs as needed for sleep 90 capsule 1   Cyanocobalamin (VITAMIN B 12 PO) Take by mouth. (Patient not taking: Reported on 09/01/2022)     scopolamine (TRANSDERM-SCOP) 1 MG/3DAYS Place 1 patch (1.5 mg total) onto the skin every 3 (three) days. (Patient not taking: Reported on 09/01/2022) 4 patch 1   Semaglutide-Weight Management (WEGOVY) 1.7 MG/0.75ML SOAJ Inject 1.7 mg into the skin once a week. (Patient not taking: Reported on 09/01/2022) 3 mL 5   FIBER ADULT GUMMIES PO Take by mouth. (Patient not taking: Reported on 09/01/2022)     No facility-administered medications prior to visit.    Allergies  Allergen Reactions   Penicillins Hives    Review of Systems  Constitutional:  Negative for appetite change, chills, fatigue and fever.  HENT:  Negative for congestion, dental problem, ear pain  and sore throat.   Eyes:  Negative for discharge, redness and visual disturbance.  Respiratory:  Negative for cough, chest tightness, shortness of breath and wheezing.   Cardiovascular:  Negative for chest pain, palpitations and leg swelling.  Gastrointestinal:  Negative for abdominal pain, blood in stool, diarrhea, nausea  and vomiting.  Genitourinary:  Negative for difficulty urinating, dysuria, flank pain, frequency, hematuria and urgency.  Musculoskeletal:  Positive for arthralgias (right hip). Negative for back pain, joint swelling, myalgias and neck stiffness.  Skin:  Negative for pallor and rash.  Neurological:  Negative for dizziness, speech difficulty, weakness and headaches.  Hematological:  Negative for adenopathy. Does not bruise/bleed easily.  Psychiatric/Behavioral:  Negative for confusion and sleep disturbance. The patient is not nervous/anxious.     PE;    09/01/2022    9:34 AM 03/03/2022    2:24 PM 11/29/2021    3:32 PM  Vitals with BMI  Height 5' 3.386" 5\' 4"  5\' 4"   Weight 157 lbs 10 oz 165 lbs 6 oz 179 lbs 6 oz  BMI 27.58 123456 [id number]  Systolic 123XX123 [id number]  Diastolic 74 72 69  Pulse 64 64 59     Exam chaperoned by Deveron Furlong, CMA. Gen: Alert, well appearing.  Patient is oriented to person, place, time, and situation. AFFECT: pleasant, lucid thought and speech. ENT: Ears: EACs clear, normal epithelium.  TMs with good light reflex and landmarks bilaterally.  Eyes: no injection, icteris, swelling, or exudate.  EOMI, PERRLA. Nose: no drainage or turbinate edema/swelling.  No injection or focal lesion.  Mouth: lips without lesion/swelling.  Oral mucosa pink and moist.  Dentition intact and without obvious caries or gingival swelling.  Oropharynx without erythema, exudate, or swelling.  Neck: supple/nontender.  No LAD, mass, or TM.  Carotid pulses 2+ bilaterally, without bruits. CV: RRR, no m/r/g.   LUNGS: CTA bilat, nonlabored resps, good aeration in all lung  fields. ABD: soft, NT, ND, BS normal.  No hepatospenomegaly or mass.  No bruits. EXT: no clubbing, cyanosis, or edema.  Musculoskeletal: no joint swelling, erythema, warmth, or tenderness.  ROM of all joints intact. Skin - no sores or suspicious lesions or rashes or color changes  Pertinent labs:  Lab Results  Component Value Date   TSH 3.33 07/30/2021   Lab Results  Component Value Date   WBC 6.1 03/03/2022   HGB 13.4 03/03/2022   HCT 39.4 03/03/2022   MCV 91.2 03/03/2022   PLT 356.0 03/03/2022   Lab Results  Component Value Date   CREATININE 0.94 07/30/2021   BUN 15 07/30/2021   NA 136 07/30/2021   K 4.2 07/30/2021   CL 101 07/30/2021   CO2 28 07/30/2021   Lab Results  Component Value Date   ALT 12 07/30/2021   AST 16 07/30/2021   ALKPHOS 63 07/30/2021   BILITOT 0.4 07/30/2021   Lab Results  Component Value Date   CHOL 247 (H) 07/30/2021   Lab Results  Component Value Date   HDL 63.30 07/30/2021   Lab Results  Component Value Date   LDLCALC 160 (H) 07/30/2021   Lab Results  Component Value Date   TRIG 118.0 07/30/2021   Lab Results  Component Value Date   CHOLHDL 4 07/30/2021   Lab Results  Component Value Date   VITAMINB12 209 (L) 07/31/2021   Lab Results  Component Value Date   IRON 92 03/03/2022   TIBC 353 03/03/2022   FERRITIN 21 03/03/2022   Lab Results  Component Value Date   HGBA1C 5.3 11/29/2021   ASSESSMENT AND PLAN:   #1 health maintenance exam: Reviewed age and gender appropriate health maintenance issues (prudent diet, regular exercise, health risks of tobacco and excessive alcohol, use of seatbelts, fire alarms in home, use of sunscreen).  Also reviewed  age and gender appropriate health screening as well as vaccine recommendations. Vaccines: ALL UTD Labs: HP + a1c Cervical ca screening: per Dr. Stann Mainland. Breast ca screening: via Dr. Stann Mainland, rpt due 10/2022. Colon ca screening: she had colonoscopy 11/2021-->adenoma x 2.  2.   Insomnia, doing well on nightly temazepam 15 mg. Controlled substance contact updated today.  Urine drug screen today.  #90, refill x 1.  3.  Weight management encounter, prediabetes. She has lost around 50 pounds since getting on Wegovy.  She is currently at the 2.4 mg weekly injection and we will stay on this. Hemoglobin A1c today.  4. iron deficiency anemia: Due to combination of excessive blood loss with menses as well as frequent blood donations. Has been on iron supplement for the last 12 months. Recheck iron and CBC today.   3 vitamin B12 deficiency. She did not tolerate the sublingual B12 very well.  She then switched to regular pill but has not been doing it much due to the life interruption with all of her orthopedic issues lately.  Recheck level today.  An After Visit Summary was printed and given to the patient.  FOLLOW UP:  Return in about 6 months (around 03/04/2023) for routine chronic illness f/u.  Signed:  Crissie Sickles, MD           09/01/2022

## 2022-09-01 NOTE — Patient Instructions (Signed)

## 2022-09-02 LAB — IRON,TIBC AND FERRITIN PANEL
%SAT: 38 % (calc) (ref 16–45)
Ferritin: 63 ng/mL (ref 16–232)
Iron: 124 ug/dL (ref 45–160)
TIBC: 327 mcg/dL (calc) (ref 250–450)

## 2022-09-02 LAB — T4, FREE: Free T4: 0.88 ng/dL (ref 0.60–1.60)

## 2022-09-02 LAB — VITAMIN B12: Vitamin B-12: 220 pg/mL (ref 211–911)

## 2022-09-02 LAB — TSH: TSH: 2.51 u[IU]/mL (ref 0.35–5.50)

## 2022-09-02 LAB — T3: T3, Total: 83 ng/dL (ref 76–181)

## 2022-09-04 LAB — DRUG MONITORING PANEL 376104, URINE
Alphahydroxyalprazolam: NEGATIVE ng/mL (ref <25)
Alphahydroxymidazolam: NEGATIVE ng/mL (ref <50)
Alphahydroxytriazolam: NEGATIVE ng/mL (ref <50)
Aminoclonazepam: NEGATIVE ng/mL (ref <25)
Amphetamines: NEGATIVE ng/mL (ref <500)
Barbiturates: NEGATIVE ng/mL (ref <300)
Benzodiazepines: POSITIVE ng/mL — AB (ref <100)
Cocaine Metabolite: NEGATIVE ng/mL (ref <150)
Desmethyltramadol: NEGATIVE ng/mL (ref <100)
Hydroxyethylflurazepam: NEGATIVE ng/mL (ref <50)
Lorazepam: NEGATIVE ng/mL (ref <50)
Nordiazepam: NEGATIVE ng/mL (ref <50)
Opiates: NEGATIVE ng/mL (ref <100)
Oxazepam: 4283 ng/mL — ABNORMAL HIGH (ref <50)
Oxycodone: NEGATIVE ng/mL (ref <100)
Temazepam: >6250 ng/mL — ABNORMAL HIGH (ref <50)
Tramadol: NEGATIVE ng/mL (ref <100)

## 2022-09-04 LAB — DM TEMPLATE

## 2022-10-14 ENCOUNTER — Other Ambulatory Visit: Payer: Self-pay | Admitting: Family Medicine

## 2022-10-21 ENCOUNTER — Other Ambulatory Visit: Payer: Self-pay

## 2022-10-21 NOTE — Telephone Encounter (Signed)
Patient refill request.  [facility]  Semaglutide-Weight Management (WEGOVY) 2.4 MG/0.75ML SOAJ

## 2022-10-21 NOTE — Telephone Encounter (Signed)
Pt advised that she has another refill at her pharmacy. She will call her pharmacy

## 2022-10-22 MED ORDER — WEGOVY 2.4 MG/0.75ML ~~LOC~~ SOAJ: 2.4000 mg | SUBCUTANEOUS | 3 refills | Status: DC

## 2022-10-22 NOTE — Telephone Encounter (Signed)
RF request for Wegovy LOV: 09/01/22 Next ov: 03/04/23 Last written: 06/17/22 (3ml, 3)  Confirmed with pharmacy pt has no refills available, please advise if appropriate. Med pending

## 2022-10-22 NOTE — Telephone Encounter (Signed)
Patient [facility] pharmacy.  [facility] another [facility]ill left on prescription.  [facility]  Semaglutide-Weight Management (WEGOVY) 2.4 MG/0.75ML SOAJ

## 2022-10-23 ENCOUNTER — Telehealth: Payer: Self-pay

## 2022-10-23 NOTE — Telephone Encounter (Signed)
Gabriella Stephens (Key: [id number]) Rx #: [id number] [phone] 2.4MG /0.75ML auto-injectors Form [employer] Electronic Prior Authorization Form [phone] NCPDP)  PA status currently pending

## 2022-12-19 LAB — HM MAMMOGRAPHY

## 2023-01-22 ENCOUNTER — Encounter (INDEPENDENT_AMBULATORY_CARE_PROVIDER_SITE_OTHER): Payer: Self-pay

## 2023-02-22 ENCOUNTER — Other Ambulatory Visit: Payer: Self-pay | Admitting: Family Medicine

## 2023-02-23 NOTE — Telephone Encounter (Signed)
RF request for Semaglutide-Weight Management (WEGOVY) 2.4 MG/0.75ML SOAJ  LOV:  09/01/22 Next ov: 03/04/23 Last written: 10/22/22 (41ml,3)  Pt should have enough until next appt, 9/25.

## 2023-03-03 NOTE — Progress Notes (Unsigned)
OFFICE VISIT  03/04/2023  CC:  Chief Complaint  Patient presents with   Medical Management of Chronic Issues    Pt is fasting    Patient is a 52 y.o. female who presents for 6 mo f/u insomnia, wt mgmt, prediabetes. A/P as of last visit: "  Insomnia, doing well on nightly temazepam 15 mg. Controlled substance contact updated today.  Urine drug screen today. #90, refill x 1.   3.  Weight management encounter, prediabetes. She has lost around 50 pounds since getting on Wegovy.  She is currently at the 2.4 mg weekly injection and we will stay on this. Hemoglobin A1c today.   4. iron deficiency anemia: Due to combination of excessive blood loss with menses as well as frequent blood donations. Has been on iron supplement for the last 12 months. Recheck iron and CBC today.   3 vitamin B12 deficiency. She did not tolerate the sublingual B12 very well.  She then switched to regular pill but has not been doing it much due to the life interruption with all of her orthopedic issues lately.  Recheck level today."  INTERIM HX: Gabriella Stephens feels well other than being pretty tired at the end of her day. She stopped iron after last visit.  She has not donated blood since last visit. She says she still has heavy menses.  She has not been taking B12 with any regularity.  Feels like mood and anxiety levels are stable.    She has success initiating sleep only with use of temazepam 15 mg at bedtime.  She cannot sleep usually more than 5 hours.  She usually feels rested in the morning. PMP AWARE reviewed today: most recent rx for temazepam was filled 02/07/23, # 90, rx by me. No red flags.  ROS as above, plus--> no fevers, no CP, no SOB, no wheezing, no cough, no dizziness, no HAs, no rashes, no melena/hematochezia.  No polyuria or polydipsia.  No myalgias or arthralgias.  No focal weakness, paresthesias, or tremors.  No acute vision or hearing abnormalities.  No dysuria or unusual/new urinary urgency or  frequency.  No recent changes in lower legs. No n/v/d or abd pain.  No palpitations.    Past Medical History:  Diagnosis Date   Abnormal mammogram 09/2020   L breast, ? complicated cysts?-->bx 09/24/20 showed fibroadenomas x 2-->resume annual mammogram screening.   Anxiety and depression    Eustachian tube dysfunction, right 10/2017   Persistent (6 wks)-->failed nasal steroid, decongestant, and systemic steroid trial.  ENT to do tympanostomy tube as of 11/10/17.   Hepatic hemangioma    small, right side: seen on u/s 2013, no change on CT 2018.   History of adenomatous polyp of colon 10/15/2020   many->rpt 6 mo recommended   Hypercholesterolemia 05/2019   Mild->TLC   Iron deficiency anemia 07/2021   Suspect due to menorrhagia   Menorrhagia    IDA 2023   Obesity, Class II, BMI 35-39.9    Palpitations 10/2017   48 H Holter with some PAC's and PVCs, brief run of atrial ectopy.  No afib.   Prediabetes    Subclinical hypothyroidism 05/2019   TSH around 5. Plan rechk labs 6 mo.   Vitamin B12 deficiency 07/2021    Past Surgical History:  Procedure Laterality Date   48 HR HOLTER  10/2017   48 H Holter with some PAC's and PVCs, brief run of atrial ectopy.  No afib.   BREAST BIOPSY Left 09/24/2020   Fibroadenoma  BUNIONECTOMY     right foot   CESAREAN SECTION  x 2   1995; 2000   COLONOSCOPY     11/2021 adenoma x 2   COLONOSCOPY W/ POLYPECTOMY  10/15/2020   +adenomas->rpt 6 mo recommended   KNEE SURGERY Right 08/2022   POLYPECTOMY     TONSILLECTOMY  1973   as a child   TUBAL LIGATION  2001   UPPER GASTROINTESTINAL ENDOSCOPY  2013   for epigastric pain. Mild antral gastritis on visualization, bx/ h pylori neg    Outpatient Medications Prior to Visit  Medication Sig Dispense Refill   Docusate Calcium (STOOL SOFTENER PO) Take by mouth daily.     temazepam (RESTORIL) 15 MG capsule 1 tab po qhs as needed for sleep 90 capsule 1   ferrous sulfate 325 (65 FE) MG EC tablet Take 325 mg  by mouth 3 (three) times daily with meals.     Semaglutide-Weight Management (WEGOVY) 2.4 MG/0.75ML SOAJ Inject 2.4 mg into the skin once a week. 3 mL 3   sertraline (ZOLOFT) 100 MG tablet Take 1 tablet (100 mg total) by mouth daily. 90 tablet 3   Cyanocobalamin (VITAMIN B 12 PO) Take by mouth. (Patient not taking: Reported on 09/01/2022)     scopolamine (TRANSDERM-SCOP) 1 MG/3DAYS Place 1 patch (1.5 mg total) onto the skin every 3 (three) days. (Patient not taking: Reported on 09/01/2022) 4 patch 1   Semaglutide-Weight Management (WEGOVY) 1.7 MG/0.75ML SOAJ Inject 1.7 mg into the skin once a week. (Patient not taking: Reported on 09/01/2022) 3 mL 5   No facility-administered medications prior to visit.    Allergies  Allergen Reactions   Penicillins Hives    Review of Systems As per HPI  PE:    03/04/2023    7:59 AM 09/01/2022    9:34 AM 03/03/2022    2:24 PM  Vitals with BMI  Height 5' 3.39" 5' 3.386" 5\' 4"   Weight 160 lbs 10 oz 157 lbs 10 oz 165 lbs 6 oz  BMI 28.1 27.58 28.38  Systolic 112 108 295  Diastolic 74 74 72  Pulse 60 64 64     Physical Exam  Gen: Alert, well appearing.  Patient is oriented to person, place, time, and situation. AFFECT: pleasant, lucid thought and speech. No further exam today  LABS:  Last CBC Lab Results  Component Value Date   WBC 5.5 09/01/2022   HGB 14.7 09/01/2022   HCT 43.3 09/01/2022   MCV 93.0 09/01/2022   MCH 27.8 11/29/2021   RDW 14.0 09/01/2022   PLT 367.0 09/01/2022   Lab Results  Component Value Date   IRON 124 09/01/2022   TIBC 327 09/01/2022   FERRITIN 63 09/01/2022    Last metabolic panel Lab Results  Component Value Date   GLUCOSE 84 09/01/2022   NA 135 09/01/2022   K 4.4 09/01/2022   CL 103 09/01/2022   CO2 26 09/01/2022   BUN 15 09/01/2022   CREATININE 0.97 09/01/2022   GFR 67.22 09/01/2022   CALCIUM 9.4 09/01/2022   PROT 7.0 09/01/2022   ALBUMIN 4.4 09/01/2022   BILITOT 0.6 09/01/2022   ALKPHOS 41  09/01/2022   AST 12 09/01/2022   ALT 11 09/01/2022   ANIONGAP 10 12/20/2016   Last lipids Lab Results  Component Value Date   CHOL 239 (H) 09/01/2022   HDL 66.60 09/01/2022   LDLCALC 158 (H) 09/01/2022   TRIG 75.0 09/01/2022   CHOLHDL 4 09/01/2022   Last hemoglobin  A1c Lab Results  Component Value Date   HGBA1C 5.3 09/01/2022   Last thyroid functions Lab Results  Component Value Date   TSH 2.51 09/01/2022   T3TOTAL 83 09/01/2022   Last vitamin B12 and Folate Lab Results  Component Value Date   VITAMINB12 220 09/01/2022   IMPRESSION AND PLAN:  #1 weight management, doing great on Wegovy 2.4 mg weekly. She has lost around 50 pounds since getting on Wegovy. She has history of prediabetes so we will monitor her hemoglobin A1c today.  2.  Insomnia. Doing pretty well on temazepam 50 mg nightly.  #3 history of iron deficiency anemia-->Due to combination of excessive blood loss with menses as well as frequent blood donations. She was on iron for 12 months and then hemoglobin and iron panel normal so we discontinued this 6 months ago.  Continues to have heavy menses. Recheck iron and CBC today.  #4 Vitamin B12 deficiency.  Not taking supplement regularly. B12 level today.  An After Visit Summary was printed and given to the patient.  FOLLOW UP: Return in about 6 months (around 09/01/2023) for annual CPE (fasting). Next cpe March/April 2025 Signed:  Santiago Bumpers, MD           03/04/2023

## 2023-03-04 ENCOUNTER — Ambulatory Visit (INDEPENDENT_AMBULATORY_CARE_PROVIDER_SITE_OTHER): Payer: BC Managed Care – PPO | Admitting: Family Medicine

## 2023-03-04 ENCOUNTER — Encounter: Payer: Self-pay | Admitting: Family Medicine

## 2023-03-04 VITALS — BP 112/74 | HR 60 | Temp 98.0°F | Ht 63.39 in | Wt 160.6 lb

## 2023-03-04 DIAGNOSIS — Z23 Encounter for immunization: Secondary | ICD-10-CM | POA: Diagnosis not present

## 2023-03-04 DIAGNOSIS — F5101 Primary insomnia: Secondary | ICD-10-CM | POA: Diagnosis not present

## 2023-03-04 DIAGNOSIS — E538 Deficiency of other specified B group vitamins: Secondary | ICD-10-CM

## 2023-03-04 DIAGNOSIS — R7303 Prediabetes: Secondary | ICD-10-CM | POA: Diagnosis not present

## 2023-03-04 DIAGNOSIS — Z7689 Persons encountering health services in other specified circumstances: Secondary | ICD-10-CM

## 2023-03-04 DIAGNOSIS — Z862 Personal history of diseases of the blood and blood-forming organs and certain disorders involving the immune mechanism: Secondary | ICD-10-CM | POA: Diagnosis not present

## 2023-03-04 LAB — CBC
HCT: 42.7 % (ref 36.0–46.0)
Hemoglobin: 14 g/dL (ref 12.0–15.0)
MCHC: 32.7 g/dL (ref 30.0–36.0)
MCV: 93.9 fl (ref 78.0–100.0)
Platelets: 378 10*3/uL (ref 150.0–400.0)
RBC: 4.55 Mil/uL (ref 3.87–5.11)
RDW: 14.4 % (ref 11.5–15.5)
WBC: 5.7 10*3/uL (ref 4.0–10.5)

## 2023-03-04 LAB — BASIC METABOLIC PANEL
BUN: 11 mg/dL (ref 6–23)
CO2: 27 mEq/L (ref 19–32)
Calcium: 9.4 mg/dL (ref 8.4–10.5)
Chloride: 103 mEq/L (ref 96–112)
Creatinine, Ser: 0.85 mg/dL (ref 0.40–1.20)
GFR: 78.48 mL/min (ref >60.00)
Glucose, Bld: 89 mg/dL (ref 70–99)
Potassium: 4.4 mEq/L (ref 3.5–5.1)
Sodium: 137 mEq/L (ref 135–145)

## 2023-03-04 LAB — HEMOGLOBIN A1C: Hgb A1c MFr Bld: 5.4 % (ref 4.6–6.5)

## 2023-03-04 LAB — VITAMIN B12: Vitamin B-12: 170 pg/mL — ABNORMAL LOW (ref 211–911)

## 2023-03-04 MED ORDER — WEGOVY 2.4 MG/0.75ML ~~LOC~~ SOAJ: 2.4000 mg | SUBCUTANEOUS | 5 refills | Status: DC

## 2023-03-04 MED ORDER — SERTRALINE HCL 100 MG PO TABS: 100.0000 mg | ORAL_TABLET | Freq: Every day | ORAL | 3 refills | Status: DC

## 2023-03-05 LAB — IRON,TIBC AND FERRITIN PANEL
%SAT: 36 % (calc) (ref 16–45)
Ferritin: 33 ng/mL (ref 16–232)
Iron: 125 ug/dL (ref 45–160)
TIBC: 343 mcg/dL (calc) (ref 250–450)

## 2023-03-18 ENCOUNTER — Encounter: Payer: Self-pay | Admitting: Family Medicine

## 2023-04-15 ENCOUNTER — Encounter: Payer: Self-pay | Admitting: Family Medicine

## 2023-04-15 NOTE — Telephone Encounter (Signed)
Signed and put in box to go up front. No charge. Signed:  Santiago Bumpers, MD           04/15/2023

## 2023-04-15 NOTE — Telephone Encounter (Signed)
Forms given to provider for review and signature, please confirm on green sheet if charging for form completion

## 2023-04-15 NOTE — Telephone Encounter (Signed)
Forms printed and result measurements documented. Confirmed with patient, waist circumference and how she would like to receive completed forms.

## 2023-05-06 ENCOUNTER — Other Ambulatory Visit: Payer: Self-pay | Admitting: Family Medicine

## 2023-05-11 NOTE — Telephone Encounter (Signed)
[facility]ill requested [facility] [place]. Next OV 09/03/23

## 2023-05-15 ENCOUNTER — Encounter: Payer: Self-pay | Admitting: Family Medicine

## 2023-05-15 NOTE — Telephone Encounter (Signed)
Please complete PA

## 2023-05-20 ENCOUNTER — Telehealth: Payer: Self-pay

## 2023-05-20 ENCOUNTER — Other Ambulatory Visit (HOSPITAL_COMMUNITY): Payer: Self-pay

## 2023-05-20 NOTE — Telephone Encounter (Signed)
Pharmacy Patient Advocate Encounter   Received notification from Patient Advice Request messages that prior authorization for [facility] 2.4MG /0.75ML auto-injectors is required/requested.   Insurance verification completed.   The patient is insured through [facility] .   Per test claim: PA required; PA submitted to above mentioned insurance via CoverMyMeds Key/confirmation #/EOC [id number] Status is pending

## 2023-05-21 ENCOUNTER — Other Ambulatory Visit (HOSPITAL_COMMUNITY): Payer: Self-pay

## 2023-05-21 NOTE — Telephone Encounter (Signed)
Pharmacy Patient Advocate Encounter  Received notification from [facility] that Prior Authorization for [facility] 2.4MG  has been APPROVED from 05/20/23 to 11/18/23. Ran test claim, Copay is $24.99. This test claim was processed through [facility]- copay amounts may vary at other pharmacies due to pharmacy/plan contracts, or as the patient moves through the different stages of their insurance plan.   PA #/Case ID/Reference #: [id number]

## 2023-07-03 IMAGING — MG DIGITAL DIAGNOSTIC BILAT W/ TOMO W/ CAD
6 of 12 series · 6 of 36 positions shown · non-contrast
Comparison: Previous exam(s).

CLINICAL DATA: Patient returns after screening study for evaluation
of possible asymmetries in both breast.

EXAM:
DIGITAL DIAGNOSTIC BILATERAL MAMMOGRAM WITH TOMOSYNTHESIS AND CAD
TECHNIQUE: Bilateral digital diagnostic mammography and breast tomosynthesis
was performed. The images were evaluated with computer-aided
detection.

[R ML synth-2D]
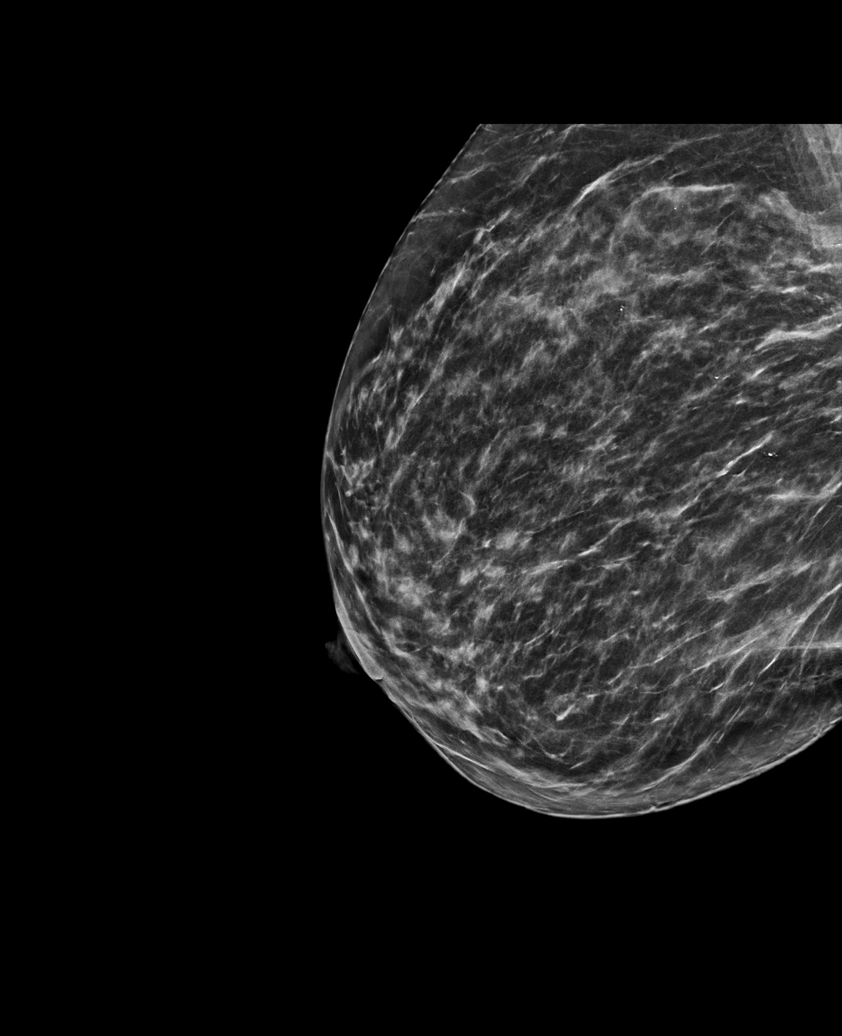

[R MLO synth-2D (1 of 3)]
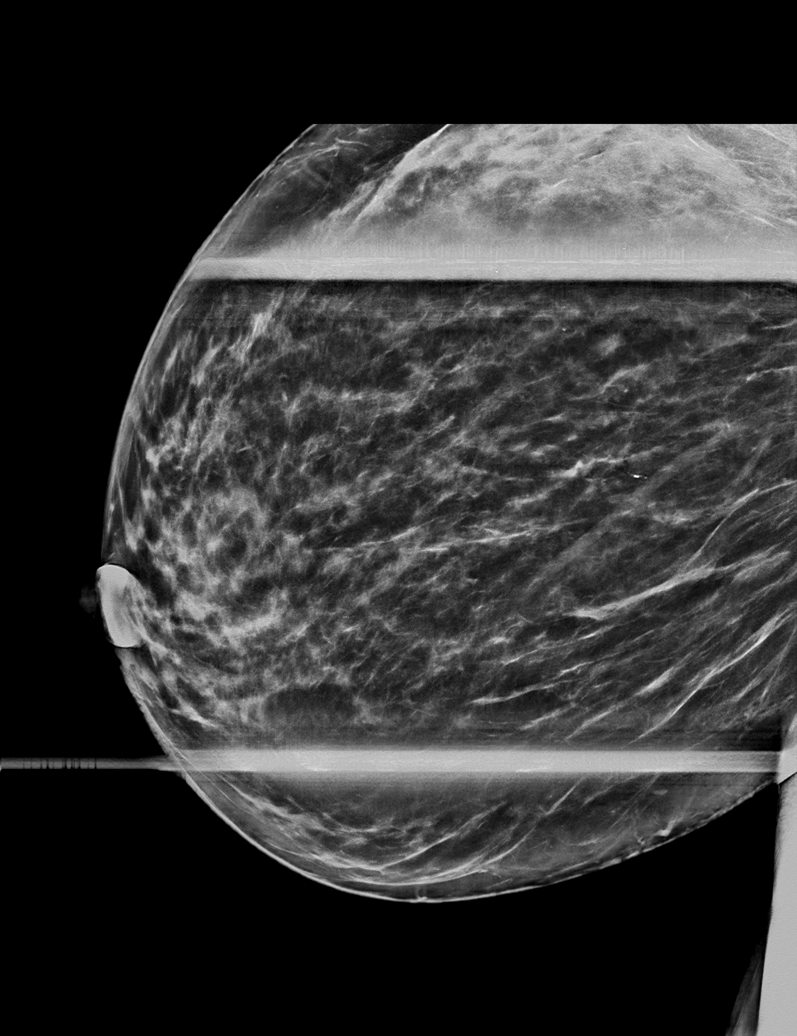

[L ML synth-2D]
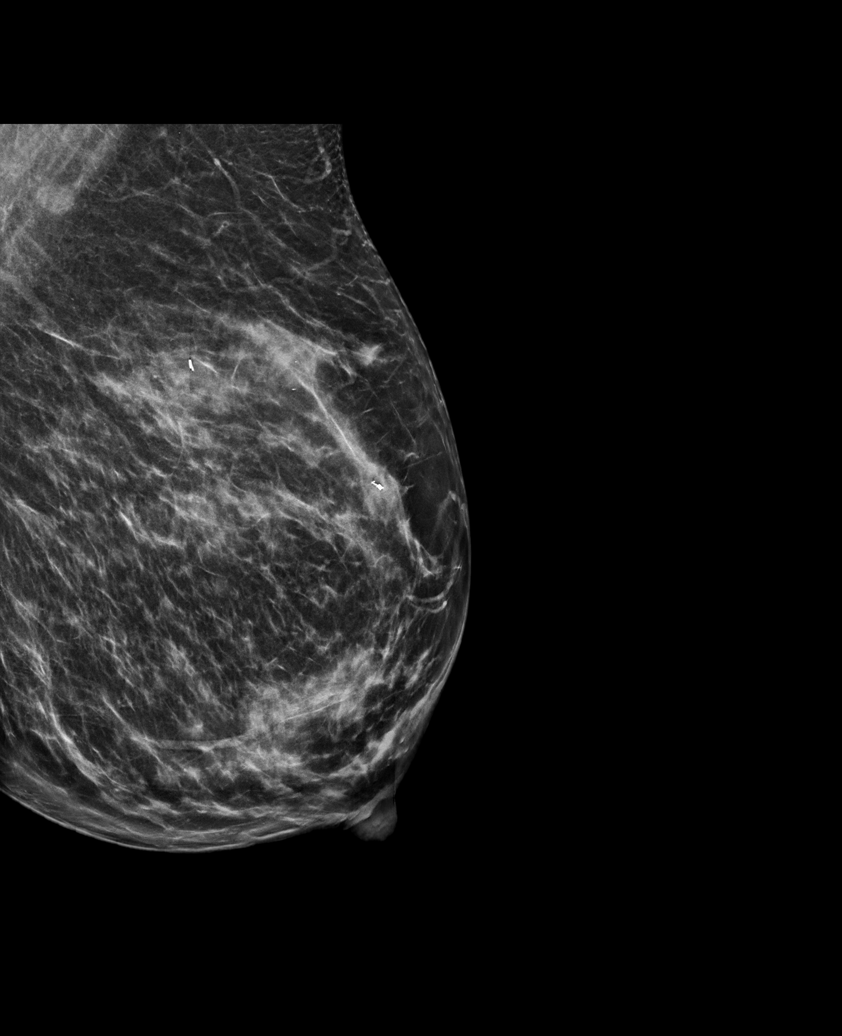

[R MLO synth-2D (2 of 3)]
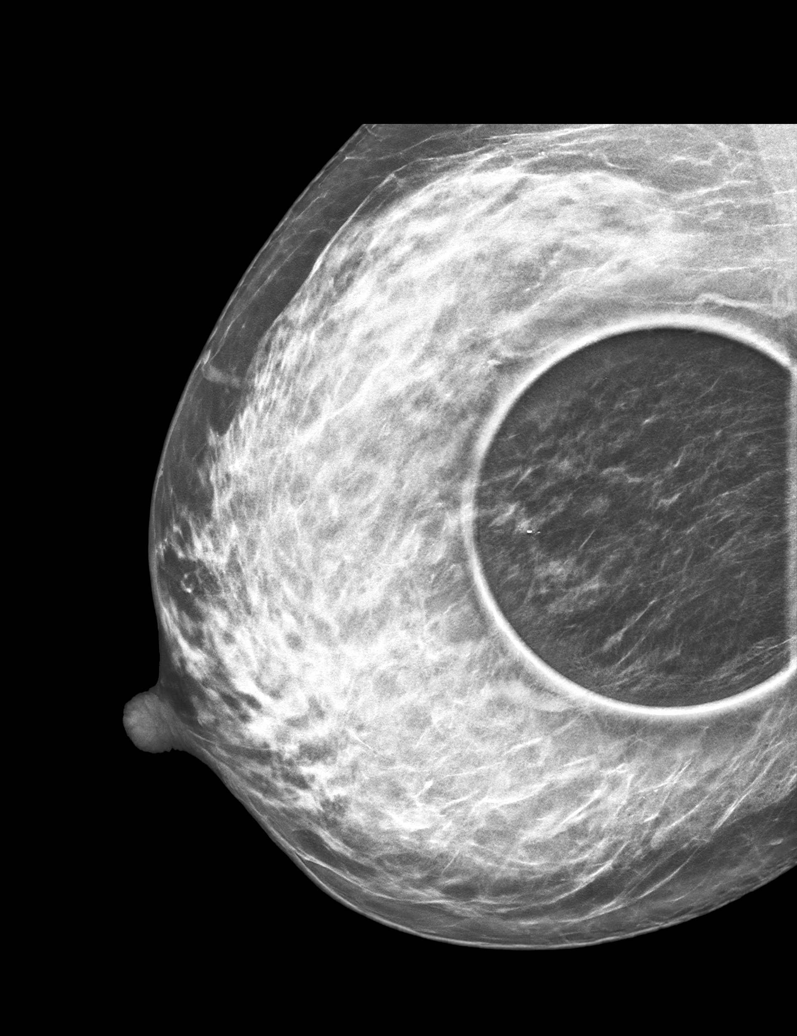

[R MLO synth-2D (3 of 3)]
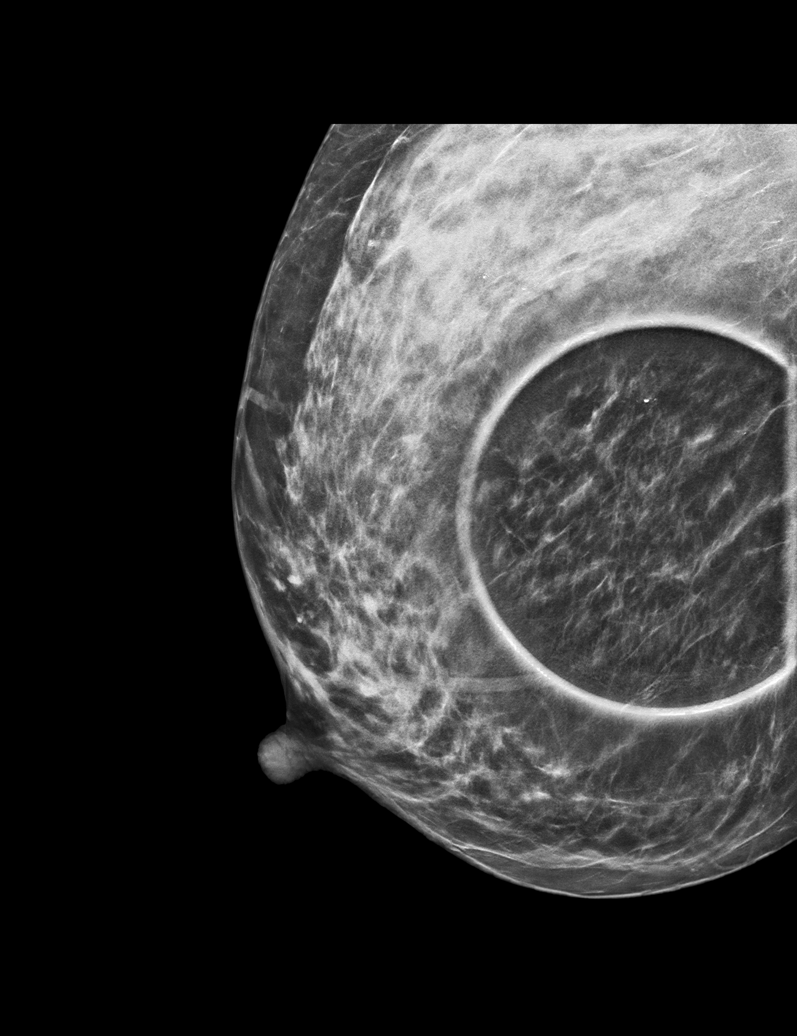

[L MLO synth-2D]
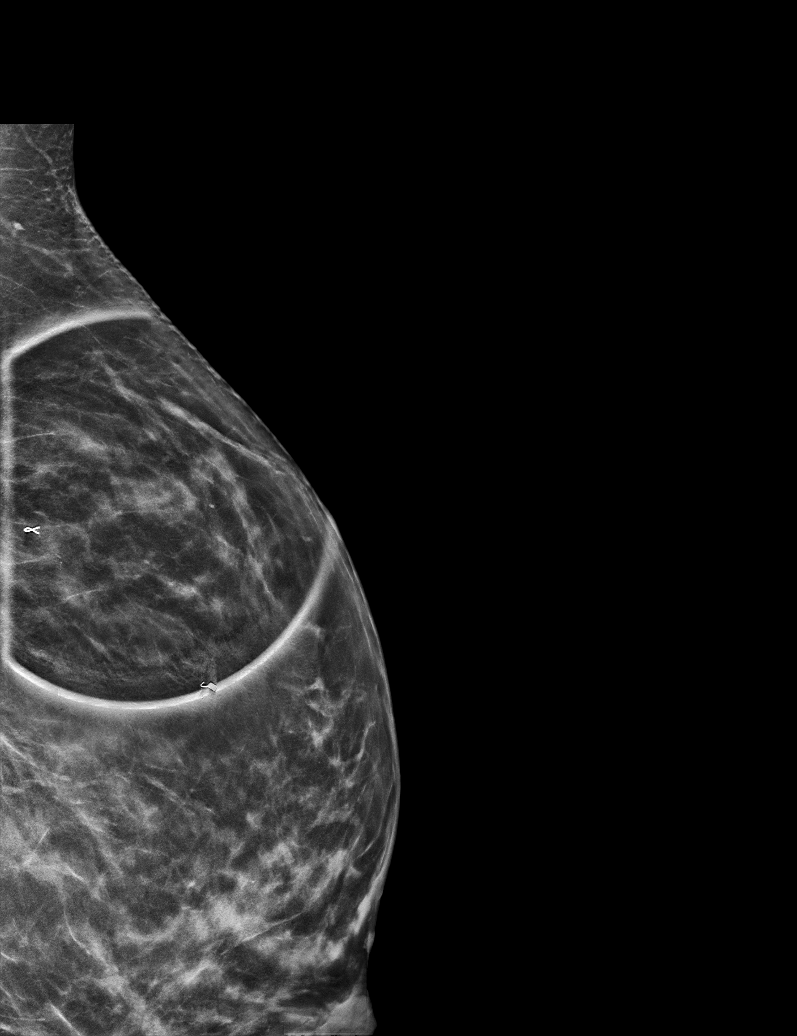

[6 of 36 positions shown; findings below may reference images not displayed]

ACR Breast Density Category c: The breast tissue is heterogeneously
dense, which may obscure small masses.
FINDINGS: RIGHT BREAST:

Mammogram: Additional 2-D and 3-D images are performed. These views
show no persistent asymmetry in the RIGHT breast. Mammographic
images were processed with CAD.

LEFT BREAST:

Mammogram: Additional 2-D and 3-D images are performed. These views
show no persistent asymmetry in the LEFT breast. Mammographic images
were processed with CAD.
IMPRESSION: No mammographic evidence for malignancy.

RECOMMENDATION:
Screening mammogram in one year.(Code:[id number])

I have discussed the findings and recommendations with the patient.
If applicable, a reminder letter will be sent to the patient
regarding the next appointment.

BI-RADS CATEGORY  1: Negative.

## 2023-09-03 ENCOUNTER — Encounter: Payer: BC Managed Care – PPO | Admitting: Family Medicine

## 2023-09-09 ENCOUNTER — Other Ambulatory Visit: Payer: Self-pay | Admitting: Family Medicine

## 2023-09-11 NOTE — Telephone Encounter (Signed)
 Copied from CRM [id number]. Topic: Clinical - Prescription Issue >> Sep 11, 2023  9:13 AM Gurney Maxin H wrote: Reason for CRM: Patient called to inquire why her WEGOVY 2.4 MG/0.75ML SOAJ was denied, advised patient per notes refill too early, patient states she took her last pen on Wednesday and out of the medication, please reach out to patient, thanks.  Danford Bad [phone]

## 2023-09-16 ENCOUNTER — Ambulatory Visit (INDEPENDENT_AMBULATORY_CARE_PROVIDER_SITE_OTHER): Admitting: Family Medicine

## 2023-09-16 ENCOUNTER — Encounter: Payer: Self-pay | Admitting: Family Medicine

## 2023-09-16 VITALS — BP 103/69 | HR 67 | Temp 98.2°F | Ht 63.5 in | Wt 160.4 lb

## 2023-09-16 DIAGNOSIS — G4719 Other hypersomnia: Secondary | ICD-10-CM

## 2023-09-16 DIAGNOSIS — R5382 Chronic fatigue, unspecified: Secondary | ICD-10-CM

## 2023-09-16 DIAGNOSIS — E538 Deficiency of other specified B group vitamins: Secondary | ICD-10-CM | POA: Diagnosis not present

## 2023-09-16 DIAGNOSIS — Z7689 Persons encountering health services in other specified circumstances: Secondary | ICD-10-CM

## 2023-09-16 DIAGNOSIS — Z79899 Other long term (current) drug therapy: Secondary | ICD-10-CM

## 2023-09-16 DIAGNOSIS — E038 Other specified hypothyroidism: Secondary | ICD-10-CM

## 2023-09-16 DIAGNOSIS — R7303 Prediabetes: Secondary | ICD-10-CM | POA: Diagnosis not present

## 2023-09-16 DIAGNOSIS — Z8639 Personal history of other endocrine, nutritional and metabolic disease: Secondary | ICD-10-CM

## 2023-09-16 DIAGNOSIS — F5101 Primary insomnia: Secondary | ICD-10-CM | POA: Diagnosis not present

## 2023-09-16 DIAGNOSIS — Z0000 Encounter for general adult medical examination without abnormal findings: Secondary | ICD-10-CM | POA: Diagnosis not present

## 2023-09-16 DIAGNOSIS — E7800 Pure hypercholesterolemia, unspecified: Secondary | ICD-10-CM

## 2023-09-16 MED ORDER — WEGOVY 2.4 MG/0.75ML ~~LOC~~ SOAJ: 2.4000 mg | SUBCUTANEOUS | 5 refills | Status: AC

## 2023-09-16 NOTE — Progress Notes (Signed)
 Office Note 09/16/2023  CC:  Chief Complaint  Patient presents with   Annual Exam    Pt is not fasting    Patient is a 53 y.o. female who is here for annual health maintenance exam and 24-month follow-up weight management and insomnia. A/P as of last visit: "#1 weight management, doing great on Wegovy 2.4 mg weekly. She has lost around 50 pounds since getting on Wegovy. She has history of prediabetes so we will monitor her hemoglobin A1c today.   2.  Insomnia. Doing pretty well on temazepam 50 mg nightly.   #3 history of iron deficiency anemia-->Due to combination of excessive blood loss with menses as well as frequent blood donations. She was on iron for 12 months and then hemoglobin and iron panel normal so we discontinued this 6 months ago.  Continues to have heavy menses. Recheck iron and CBC today.   #4 Vitamin B12 deficiency.  Not taking supplement regularly. B12 level today."  INTERIM HX: Gabriella Stephens is doing well other than feeling tired all the time. When she is busy at work during her day she pushes through when she gets home and sits in her chair she falls asleep very easily. Does not ever feel rejuvenated by rest.  She does not awaken with a gasp.  She does snore sometimes.  No known apneic events and sleep but she states that her husband does not observe her sleeping. She usually sleeps through the night with the help of temazepam.  Mood is good.  Anxiety level good.  She continues to get good results with Wegovy 2.4 mg weekly.  B12 was still low at last visit.  I encouraged her to take a 1000 mcg B12 sublingual tablet daily. She has been taking this every day now.  She does still have menses although they are becoming more erratic in timing. They are generally heavy for a few days followed by much lighter bleeding. She has not been donating blood at all.  PMP AWARE reviewed today: most recent rx for temazepam was filled 08/08/2023, # 90, rx by me. No red  flags.   Past Medical History:  Diagnosis Date   Abnormal mammogram 09/2020   L breast, ? complicated cysts?-->bx 09/24/20 showed fibroadenomas x 2-->resume annual mammogram screening.   Anxiety and depression    Eustachian tube dysfunction, right 10/2017   Persistent (6 wks)-->failed nasal steroid, decongestant, and systemic steroid trial.  ENT to do tympanostomy tube as of 11/10/17.   Hepatic hemangioma    small, right side: seen on u/s 2013, no change on CT 2018.   History of adenomatous polyp of colon 10/15/2020   many->rpt 6 mo recommended   Hypercholesterolemia 05/2019   Mild->TLC   Iron deficiency anemia 07/2021   Suspect due to menorrhagia   Menorrhagia    IDA 2023   Obesity, Class II, BMI 35-39.9    Palpitations 10/2017   48 H Holter with some PAC's and PVCs, brief run of atrial ectopy.  No afib.   Prediabetes    Subclinical hypothyroidism 05/2019   TSH around 5. Plan rechk labs 6 mo.   Vitamin B12 deficiency 07/2021    Past Surgical History:  Procedure Laterality Date   48 HR HOLTER  10/2017   48 H Holter with some PAC's and PVCs, brief run of atrial ectopy.  No afib.   BREAST BIOPSY Left 09/24/2020   Fibroadenoma   BUNIONECTOMY     right foot   CESAREAN SECTION  x 2  1995; 2000   COLONOSCOPY     11/2021 adenoma x 2   COLONOSCOPY W/ POLYPECTOMY  10/15/2020   +adenomas->rpt 6 mo recommended   KNEE SURGERY Right 08/2022   POLYPECTOMY     TONSILLECTOMY  1973   as a child   TUBAL LIGATION  2001   UPPER GASTROINTESTINAL ENDOSCOPY  2013   for epigastric pain. Mild antral gastritis on visualization, bx/ h pylori neg    Family History  Problem Relation Age of Onset   Thyroid disease Mother    Hypertension Mother    Diabetes Mother    Heart disease Mother    Hyperlipidemia Mother    Breast cancer Mother    Hypertension Father    Hyperlipidemia Father    Multiple myeloma Father    Colon cancer Paternal Grandfather    Kidney disease Neg Hx    Asthma Neg Hx     Stroke Neg Hx    Esophageal cancer Neg Hx    Rectal cancer Neg Hx    Stomach cancer Neg Hx    Colon polyps Neg Hx     Social History   Socioeconomic History   Marital status: Married    Spouse name: Not on file   Number of children: 3   Years of education: Not on file   Highest education level: Not on file  Occupational History   Occupation: Event organiser: [employer]    Employer: [employer]  Tobacco Use   Smoking status: Former    Current packs/day: 0.00    Average packs/day: 1 pack/day for 20.0 years (20.0 ttl pk-yrs)    Types: Cigarettes    Start date: 06/10/1991    Quit date: 06/10/2011    Years since quitting: 12.2   Smokeless tobacco: Current   Tobacco comments:    Uses vape   Vaping Use   Vaping status: Every Day  Substance and Sexual Activity   Alcohol use: Yes    Comment: twice a month-social   Drug use: No   Sexual activity: Not on file  Other Topics Concern   Not on file  Social History Narrative   Married, 3 children.  Lives in [place].   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   Tob: 20 pack-yr hx, quit 2013.   Alc: rare.   No drugs.      Social Drivers of Corporate investment banker Strain: Not on file  Food Insecurity: Not on file  Transportation Needs: Not on file  Physical Activity: Not on file  Stress: Not on file  Social Connections: Not on file  Intimate Partner Violence: Not on file    Outpatient Medications Prior to Visit  Medication Sig Dispense Refill   Cyanocobalamin (VITAMIN B 12 PO) Take 1,000 mcg by mouth daily.     Docusate Calcium (STOOL SOFTENER PO) Take by mouth daily.     sertraline (ZOLOFT) 100 MG tablet Take 1 tablet (100 mg total) by mouth daily. 90 tablet 3   temazepam (RESTORIL) 15 MG capsule TAKE 1 CAPSULE BY MOUTH AT BEDTIME AS NEEDED FOR SLEEP 90 capsule 1   Semaglutide-Weight Management (WEGOVY) 2.4 MG/0.75ML SOAJ Inject 2.4 mg into the skin once a week. 3 mL 5   No  facility-administered medications prior to visit.    Allergies  Allergen Reactions   Penicillins Hives    Review of Systems  Constitutional:  Positive for fatigue. Negative for appetite change, chills and fever.  HENT:  Negative for congestion, dental problem, ear pain and sore throat.   Eyes:  Negative for discharge, redness and visual disturbance.  Respiratory:  Negative for cough, chest tightness, shortness of breath and wheezing.   Cardiovascular:  Negative for chest pain, palpitations and leg swelling.  Gastrointestinal:  Negative for abdominal pain, blood in stool, diarrhea, nausea and vomiting.  Genitourinary:  Negative for difficulty urinating, dysuria, flank pain, frequency, hematuria and urgency.  Musculoskeletal:  Negative for arthralgias, back pain, joint swelling, myalgias and neck stiffness.  Skin:  Negative for pallor and rash.  Neurological:  Negative for dizziness, speech difficulty, weakness and headaches.  Hematological:  Negative for adenopathy. Does not bruise/bleed easily.  Psychiatric/Behavioral:  Negative for confusion and sleep disturbance. The patient is not nervous/anxious.     PE;    09/16/2023    2:01 PM 03/04/2023    7:59 AM 09/01/2022    9:34 AM  Vitals with BMI  Height 5' 3.5" 5' 3.39" 5' 3.386"  Weight 160 lbs 6 oz 160 lbs 10 oz 157 lbs 10 oz  BMI 27.96 28.1 27.58  Systolic 103 112 161  Diastolic 69 74 74  Pulse 67 60 64    Exam chaperoned by Emi Holes, CMA. Gen: Alert, well appearing.  Patient is oriented to person, place, time, and situation. AFFECT: pleasant, lucid thought and speech. ENT: Ears: EACs clear, normal epithelium.  TMs with good light reflex and landmarks bilaterally.  Eyes: no injection, icteris, swelling, or exudate.  EOMI, PERRLA. Nose: no drainage or turbinate edema/swelling.  No injection or focal lesion.  Mouth: lips without lesion/swelling.  Oral mucosa pink and moist.  Dentition intact and without obvious caries or  gingival swelling.  Oropharynx without erythema, exudate, or swelling.  Neck: supple/nontender.  No LAD, mass, or TM.  Carotid pulses 2+ bilaterally, without bruits. CV: RRR, no m/r/g.   LUNGS: CTA bilat, nonlabored resps, good aeration in all lung fields. ABD: soft, NT, ND, BS normal.  No hepatospenomegaly or mass.  No bruits. EXT: no clubbing, cyanosis, or edema.  Musculoskeletal: no joint swelling, erythema, warmth, or tenderness.  ROM of all joints intact. Skin - no sores or suspicious lesions or rashes or color changes  Pertinent labs:  Lab Results  Component Value Date   TSH 2.51 09/01/2022   Lab Results  Component Value Date   WBC 5.7 03/04/2023   HGB 14.0 03/04/2023   HCT 42.7 03/04/2023   MCV 93.9 03/04/2023   PLT 378.0 03/04/2023   Lab Results  Component Value Date   IRON 125 03/04/2023   TIBC 343 03/04/2023   FERRITIN 33 03/04/2023   Lab Results  Component Value Date   IRON 125 03/04/2023   TIBC 343 03/04/2023   FERRITIN 33 03/04/2023   Lab Results  Component Value Date   CREATININE 0.85 03/04/2023   BUN 11 03/04/2023   NA 137 03/04/2023   K 4.4 03/04/2023   CL 103 03/04/2023   CO2 27 03/04/2023   Lab Results  Component Value Date   ALT 11 09/01/2022   AST 12 09/01/2022   ALKPHOS 41 09/01/2022   BILITOT 0.6 09/01/2022   Lab Results  Component Value Date   CHOL 239 (H) 09/01/2022   Lab Results  Component Value Date   HDL 66.60 09/01/2022   Lab Results  Component Value Date   LDLCALC 158 (H) 09/01/2022   Lab Results  Component Value Date   TRIG 75.0 09/01/2022   Lab Results  Component Value Date   CHOLHDL 4 09/01/2022   Lab Results  Component Value Date   HGBA1C 5.4 03/04/2023   Lab Results  Component Value Date   VITAMINB12 170 (L) 03/04/2023   ASSESSMENT AND PLAN:   #1 health maintenance exam: Reviewed age and gender appropriate health maintenance issues (prudent diet, regular exercise, health risks of tobacco and excessive  alcohol, use of seatbelts, fire alarms in home, use of sunscreen).  Also reviewed age and gender appropriate health screening as well as vaccine recommendations. Vaccines: ALL UTD Labs: HP + a1c Cervical ca screening: per Dr. Aldona Bar. Breast ca screening: via Dr. Aldona Bar, rpt due 12/2023. Colon ca screening: she had colonoscopy 11/2021-->2 adenomas.  Recall 11/2024 per GI MD. Lung cancer screening: She does not meet criteria (approximately 10-15 pack-yr hx).  # 2weight management, doing great on Wegovy 2.4 mg weekly. She has lost around 50 pounds since getting on Wegovy.  Maintaining well. She has history of prediabetes so we will monitor her hemoglobin A1c today.   2.  Insomnia. Doing pretty well on temazepam 15 mg nightly.   #3 history of iron deficiency anemia-->Due to combination of excessive blood loss with menses as well as frequent blood donations. She was on iron for 12 months and then hemoglobin and iron panel normal so we discontinued this 12 months ago.  Continues to have heavy menses. Recheck iron and CBC today.   #4 Vitamin B12 deficiency.  She is taking her sublingual B12 supplement every day now. B12 level today.  #5 excessive daytime sleepiness/fatigue. She will have her husband observe her while sleeping in order to see if sh may need to take the next step of getting a sleep study.  Signed:  Santiago Bumpers, MD           09/16/2023

## 2023-09-17 LAB — CBC WITH DIFFERENTIAL/PLATELET
Basophils Absolute: 0.1 10*3/uL (ref 0.0–0.1)
Basophils Relative: 1.1 % (ref 0.0–3.0)
Eosinophils Absolute: 0.5 10*3/uL (ref 0.0–0.7)
Eosinophils Relative: 6.2 % — ABNORMAL HIGH (ref 0.0–5.0)
HCT: 39.7 % (ref 36.0–46.0)
Hemoglobin: 13.3 g/dL (ref 12.0–15.0)
Lymphocytes Relative: 30.9 % (ref 12.0–46.0)
Lymphs Abs: 2.4 10*3/uL (ref 0.7–4.0)
MCHC: 33.5 g/dL (ref 30.0–36.0)
MCV: 94.3 fl (ref 78.0–100.0)
Monocytes Absolute: 0.7 10*3/uL (ref 0.1–1.0)
Monocytes Relative: 8.5 % (ref 3.0–12.0)
Neutro Abs: 4.1 10*3/uL (ref 1.4–7.7)
Neutrophils Relative %: 53.3 % (ref 43.0–77.0)
Platelets: 362 10*3/uL (ref 150.0–400.0)
RBC: 4.21 Mil/uL (ref 3.87–5.11)
RDW: 14.8 % (ref 11.5–15.5)
WBC: 7.7 10*3/uL (ref 4.0–10.5)

## 2023-09-17 LAB — COMPREHENSIVE METABOLIC PANEL WITH GFR
ALT: 10 U/L (ref 0–35)
AST: 15 U/L (ref 0–37)
Albumin: 4.3 g/dL (ref 3.5–5.2)
Alkaline Phosphatase: 41 U/L (ref 39–117)
BUN: 15 mg/dL (ref 6–23)
CO2: 29 meq/L (ref 19–32)
Calcium: 9.2 mg/dL (ref 8.4–10.5)
Chloride: 102 meq/L (ref 96–112)
Creatinine, Ser: 0.84 mg/dL (ref 0.40–1.20)
GFR: 79.31 mL/min (ref >60.00)
Glucose, Bld: 65 mg/dL — ABNORMAL LOW (ref 70–99)
Potassium: 4.1 meq/L (ref 3.5–5.1)
Sodium: 138 meq/L (ref 135–145)
Total Bilirubin: 0.3 mg/dL (ref 0.2–1.2)
Total Protein: 6.6 g/dL (ref 6.0–8.3)

## 2023-09-17 LAB — HEMOGLOBIN A1C: Hgb A1c MFr Bld: 5.2 % (ref 4.6–6.5)

## 2023-09-17 LAB — LIPID PANEL
Cholesterol: 218 mg/dL — ABNORMAL HIGH (ref 0–200)
HDL: 64.5 mg/dL (ref >39.00)
LDL Cholesterol: 133 mg/dL — ABNORMAL HIGH (ref 0–99)
NonHDL: 153.77
Total CHOL/HDL Ratio: 3
Triglycerides: 102 mg/dL (ref 0.0–149.0)
VLDL: 20.4 mg/dL (ref 0.0–40.0)

## 2023-09-17 LAB — TSH: TSH: 2.26 u[IU]/mL (ref 0.35–5.50)

## 2023-09-17 LAB — VITAMIN B12: Vitamin B-12: 951 pg/mL — ABNORMAL HIGH (ref 211–911)

## 2023-09-17 LAB — T4, FREE: Free T4: 0.78 ng/dL (ref 0.60–1.60)

## 2023-09-18 LAB — INTRINSIC FACTOR ANTIBODIES: Intrinsic Factor: NEGATIVE

## 2023-09-19 LAB — DRUG MONITORING PANEL 376104, URINE
Alphahydroxyalprazolam: NEGATIVE ng/mL (ref <25)
Alphahydroxymidazolam: NEGATIVE ng/mL (ref <50)
Alphahydroxytriazolam: NEGATIVE ng/mL (ref <50)
Aminoclonazepam: NEGATIVE ng/mL (ref <25)
Amphetamines: NEGATIVE ng/mL (ref <500)
Barbiturates: NEGATIVE ng/mL (ref <300)
Benzodiazepines: POSITIVE ng/mL — AB (ref <100)
Cocaine Metabolite: NEGATIVE ng/mL (ref <150)
Desmethyltramadol: NEGATIVE ng/mL (ref <100)
Hydroxyethylflurazepam: NEGATIVE ng/mL (ref <50)
Lorazepam: NEGATIVE ng/mL (ref <50)
Nordiazepam: NEGATIVE ng/mL (ref <50)
Opiates: NEGATIVE ng/mL (ref <100)
Oxazepam: 2907 ng/mL — ABNORMAL HIGH (ref <50)
Oxycodone: NEGATIVE ng/mL (ref <100)
Temazepam: >8000 ng/mL — ABNORMAL HIGH (ref <50)
Tramadol: NEGATIVE ng/mL (ref <100)

## 2023-09-19 LAB — IRON,TIBC AND FERRITIN PANEL
%SAT: 21 % (ref 16–45)
Ferritin: 33 ng/mL (ref 16–232)
Iron: 70 ug/dL (ref 45–160)
TIBC: 330 ug/dL (ref 250–450)

## 2023-09-19 LAB — DM TEMPLATE

## 2023-09-19 LAB — T3: T3, Total: 68 ng/dL — ABNORMAL LOW (ref 76–181)

## 2023-09-20 ENCOUNTER — Encounter: Payer: Self-pay | Admitting: Family Medicine

## 2023-09-22 ENCOUNTER — Other Ambulatory Visit (HOSPITAL_COMMUNITY): Payer: Self-pay

## 2023-09-22 ENCOUNTER — Telehealth: Payer: Self-pay

## 2023-09-22 NOTE — Telephone Encounter (Signed)
 Pharmacy Patient Advocate Encounter   Received notification from Onbase that prior authorization for [facility] 2.4MG /0.75ML auto-injectors is required/requested.   Insurance verification completed.   The patient is insured through [facility] .   Per test claim: PA required; PA submitted to above mentioned insurance via CoverMyMeds Key/confirmation #/EOC [employer] Status is pending

## 2023-09-22 NOTE — Telephone Encounter (Signed)
 Pharmacy Patient Advocate Encounter  Received notification from [employer] that Prior Authorization for Wegovy 2.4MG /0.75ML auto-injectors has been APPROVED from 09/22/23 to 05/20/24   PA #/Case ID/Reference #: [id number]

## 2023-11-10 ENCOUNTER — Other Ambulatory Visit: Payer: Self-pay | Admitting: Family Medicine

## 2023-11-11 NOTE — Telephone Encounter (Signed)
 No further action needed.

## 2023-11-11 NOTE — Telephone Encounter (Signed)
 Requesting: temazepam   Contract: 09/16/23 UDS: 09/16/23 Last Visit: 09/16/23 Next Visit: 03/17/24 Last Refill: 05/11/23 (90,1)  Please Advise. Rx pending

## 2023-12-21 LAB — HM MAMMOGRAPHY

## 2024-03-10 ENCOUNTER — Other Ambulatory Visit: Payer: Self-pay | Admitting: Family Medicine

## 2024-03-17 ENCOUNTER — Ambulatory Visit: Admitting: Family Medicine

## 2024-03-22 ENCOUNTER — Ambulatory Visit: Admitting: Family Medicine

## 2024-03-22 ENCOUNTER — Encounter: Payer: Self-pay | Admitting: Family Medicine

## 2024-03-22 VITALS — BP 115/72 | HR 75 | Temp 98.2°F | Ht 63.5 in | Wt 160.2 lb

## 2024-03-22 DIAGNOSIS — F411 Generalized anxiety disorder: Secondary | ICD-10-CM

## 2024-03-22 DIAGNOSIS — Z79899 Other long term (current) drug therapy: Secondary | ICD-10-CM

## 2024-03-22 DIAGNOSIS — G4719 Other hypersomnia: Secondary | ICD-10-CM

## 2024-03-22 DIAGNOSIS — G4733 Obstructive sleep apnea (adult) (pediatric): Secondary | ICD-10-CM

## 2024-03-22 DIAGNOSIS — Z23 Encounter for immunization: Secondary | ICD-10-CM | POA: Diagnosis not present

## 2024-03-22 DIAGNOSIS — F5101 Primary insomnia: Secondary | ICD-10-CM | POA: Diagnosis not present

## 2024-03-22 DIAGNOSIS — Z7689 Persons encountering health services in other specified circumstances: Secondary | ICD-10-CM

## 2024-03-22 MED ORDER — SERTRALINE HCL 100 MG PO TABS: 100.0000 mg | ORAL_TABLET | Freq: Every day | ORAL | 3 refills | Status: AC

## 2024-03-22 NOTE — Progress Notes (Signed)
 OFFICE VISIT  03/22/2024  CC:  Chief Complaint  Patient presents with   Medical Management of Chronic Issues    Pt is fasting    Patient is a 53 y.o. female who presents for 36-month follow-up weight management, insomnia, GAD, and excessive daytime sleepiness. A/P as of last visit: # 1 weight management, doing great on Wegovy  2.4 mg weekly. She has lost around 50 pounds since getting on Wegovy .  Maintaining well. She has history of prediabetes so we will monitor her hemoglobin A1c today.   2.  Insomnia. Doing pretty well on temazepam  15 mg nightly.   #3 history of iron deficiency anemia-->Due to combination of excessive blood loss with menses as well as frequent blood donations. She was on iron for 12 months and then hemoglobin and iron panel normal so we discontinued this 12 months ago.  Continues to have heavy menses. Recheck iron and CBC today.   #4 Vitamin B12 deficiency.  She is taking her sublingual B12 supplement every day now. B12 level today.   #5 excessive daytime sleepiness/fatigue. She will have her husband observe her while sleeping in order to see if sh may need to take the next step of getting a sleep study.  INTERIM HX: Feeling well.  Her only concern is ongoing excessive daytime sleepiness. No morning headaches. She does sometimes awaken with a sudden gasp and feels like she is choking briefly. Her husband has not had a chance to observe her breathing while she sleeps. She wakes in the morning feeling pretty refreshed and as her day goes on she gets more more sleepy tired.  On Saturdays when she does not work she fights sleep constantly and often takes a nap. Her brother and her mother have obstructive sleep apnea.  Continues to maintain weight on Wegovy  2.4 mg weekly. She is happy to report that her gynecologist put her on oral contraceptive pills for her menorrhagia and her menstrual bleeding is normal now. He checked a hemoglobin when she last saw him and  it was normal per patient report.  She continues to do well on temazepam  for history of chronic insomnia. PMP AWARE reviewed today: most recent rx for temazepam  was filled 02/08/2024, # 90, rx by me. No red flags.  ROS as above, plus--> no fevers, no CP, no SOB, no wheezing, no cough, no dizziness, no rashes, no melena/hematochezia.  No polyuria or polydipsia.  No myalgias or arthralgias.  No focal weakness, paresthesias, or tremors.  No acute vision or hearing abnormalities.  No dysuria or unusual/new urinary urgency or frequency.  No recent changes in lower legs. No n/v/d or abd pain.  No palpitations.    Past Medical History:  Diagnosis Date   Abnormal mammogram 09/2020   L breast, ? complicated cysts?-->bx 09/24/20 showed fibroadenomas x 2-->resume annual mammogram screening.   Anxiety and depression    Eustachian tube dysfunction, right 10/2017   Persistent (6 wks)-->failed nasal steroid, decongestant, and systemic steroid trial.  ENT to do tympanostomy tube as of 11/10/17.   Hepatic hemangioma    small, right side: seen on u/s 2013, no change on CT 2018.   History of adenomatous polyp of colon 10/15/2020   many->rpt 6 mo recommended   Hypercholesterolemia 05/2019   Mild->TLC   Iron deficiency anemia 07/2021   Suspect due to menorrhagia   Menorrhagia    IDA 2023   Obesity, Class II, BMI 35-39.9    Palpitations 10/2017   48 H Holter with some PAC's and  PVCs, brief run of atrial ectopy.  No afib.   Prediabetes    Subclinical hypothyroidism 05/2019   TSH around 5. Plan rechk labs 6 mo.   Vitamin B12 deficiency 07/2021    Past Surgical History:  Procedure Laterality Date   48 HR HOLTER  10/2017   48 H Holter with some PAC's and PVCs, brief run of atrial ectopy.  No afib.   BREAST BIOPSY Left 09/24/2020   Fibroadenoma   BUNIONECTOMY     right foot   CESAREAN SECTION  x 2   1995; 2000   COLONOSCOPY     11/2021 adenoma x 2   COLONOSCOPY W/ POLYPECTOMY  10/15/2020    +adenomas->rpt 6 mo recommended   KNEE SURGERY Right 08/2022   POLYPECTOMY     TONSILLECTOMY  1973   as a child   TUBAL LIGATION  2001   UPPER GASTROINTESTINAL ENDOSCOPY  2013   for epigastric pain. Mild antral gastritis on visualization, bx/ h pylori neg    Outpatient Medications Prior to Visit  Medication Sig Dispense Refill   ALYACEN 1/35 tablet Take 1 tablet by mouth daily.     Cyanocobalamin  (VITAMIN B 12 PO) Take 1,000 mcg by mouth daily.     Docusate Calcium (STOOL SOFTENER PO) Take by mouth daily.     Semaglutide -Weight Management (WEGOVY ) 2.4 MG/0.75ML SOAJ Inject 2.4 mg into the skin once a week. 9 mL 5   sertraline  (ZOLOFT ) 100 MG tablet TAKE 1 TABLET BY MOUTH EVERY DAY 30 tablet 0   temazepam  (RESTORIL ) 15 MG capsule TAKE 1 CAPSULE BY MOUTH AT BEDTIME AS NEEDED FOR SLEEP 90 capsule 1   No facility-administered medications prior to visit.    Allergies  Allergen Reactions   Penicillins Hives    Review of Systems As per HPI  PE:    03/22/2024    8:39 AM 09/16/2023    2:01 PM 03/04/2023    7:59 AM  Vitals with BMI  Height 5' 3.5 5' 3.5 5' 3.39  Weight 160 lbs 3 oz 160 lbs 6 oz 160 lbs 10 oz  BMI 27.93 27.96 28.1  Systolic 115 103 887  Diastolic 72 69 74  Pulse 75 67 60     Physical Exam  Gen: Alert, well appearing.  Patient is oriented to person, place, time, and situation. AFFECT: pleasant, lucid thought and speech. No further exam today  LABS:  Last CBC Lab Results  Component Value Date   WBC 7.7 09/16/2023   HGB 13.3 09/16/2023   HCT 39.7 09/16/2023   MCV 94.3 09/16/2023   MCH 27.8 11/29/2021   RDW 14.8 09/16/2023   PLT 362.0 09/16/2023   Lab Results  Component Value Date   IRON 70 09/16/2023   TIBC 330 09/16/2023   FERRITIN 33 09/16/2023   Last metabolic panel Lab Results  Component Value Date   GLUCOSE 65 (L) 09/16/2023   NA 138 09/16/2023   K 4.1 09/16/2023   CL 102 09/16/2023   CO2 29 09/16/2023   BUN 15 09/16/2023    CREATININE 0.84 09/16/2023   GFR 79.31 09/16/2023   CALCIUM 9.2 09/16/2023   PROT 6.6 09/16/2023   ALBUMIN 4.3 09/16/2023   BILITOT 0.3 09/16/2023   ALKPHOS 41 09/16/2023   AST 15 09/16/2023   ALT 10 09/16/2023   ANIONGAP 10 12/20/2016   Last lipids Lab Results  Component Value Date   CHOL 218 (H) 09/16/2023   HDL 64.50 09/16/2023   LDLCALC 133 (H) 09/16/2023  TRIG 102.0 09/16/2023   CHOLHDL 3 09/16/2023   Last hemoglobin A1c Lab Results  Component Value Date   HGBA1C 5.2 09/16/2023   Last thyroid  functions Lab Results  Component Value Date   TSH 2.26 09/16/2023   T3TOTAL 68 (L) 09/16/2023   Last vitamin B12 and Folate Lab Results  Component Value Date   VITAMINB12 951 (H) 09/16/2023   IMPRESSION AND PLAN:  # 1 weight management, doing great on Wegovy  2.4 mg weekly. She has lost around 50 pounds since getting on Wegovy .  Maintaining well.  Good diet and exercise habits.   2.  Insomnia. Doing pretty well on temazepam  15 mg nightly. New prescription was not needed today.   #3 excessive daytime sleepiness/fatigue. She will have her husband observe her while sleeping in order to see if sh may need to take the next step of getting a sleep study.  #4 GAD.  Doing well long-term on sertraline  100 mg nightly.  Prescription renewed x 90 days, 3 additional refills.  No labs today.  An After Visit Summary was printed and given to the patient.  FOLLOW UP: Return in about 6 months (around 09/20/2024) for annual CPE (fasting.  Signed:  Gerlene Hockey, MD           03/22/2024

## 2024-03-22 NOTE — Patient Instructions (Signed)

## 2024-03-24 ENCOUNTER — Encounter: Payer: Self-pay | Admitting: Family Medicine

## 2024-04-21 ENCOUNTER — Other Ambulatory Visit (HOSPITAL_COMMUNITY): Payer: Self-pay

## 2024-04-22 ENCOUNTER — Other Ambulatory Visit (HOSPITAL_COMMUNITY): Payer: Self-pay

## 2024-05-08 ENCOUNTER — Other Ambulatory Visit: Payer: Self-pay | Admitting: Family Medicine

## 2024-05-09 NOTE — Telephone Encounter (Signed)
 Requesting: temazepam   Contract: 09/16/23 UDS: 09/16/23 Last Visit: 03/22/24 Next Visit: 09/20/24 Last Refill: 11/11/23 (90,1)  Please Advise. Rx pending

## 2024-05-30 ENCOUNTER — Telehealth: Payer: Self-pay

## 2024-05-30 ENCOUNTER — Encounter: Payer: Self-pay | Admitting: Family Medicine

## 2024-05-30 ENCOUNTER — Other Ambulatory Visit (HOSPITAL_COMMUNITY): Payer: Self-pay

## 2024-05-30 NOTE — Telephone Encounter (Signed)
 Pharmacy Patient Advocate Encounter   Received notification from Onbase that prior authorization for Semaglutide -Weight Management (WEGOVY ) 2.4 MG/0.75ML SOA  is due for renewal.   Insurance verification completed.   The patient is insured through [facility].  Action: PA required; PA submitted to above mentioned insurance via Latent Key/confirmation #/EOC [id number] Status is pending

## 2024-06-01 NOTE — Telephone Encounter (Signed)
 Pharmacy Patient Advocate Encounter  Received notification from [employer] that Prior Authorization for Wegovy  2.4MG /0.75ML auto-injectors  has been APPROVED from 05/31/2024 to 11/29/2024   PA #/Case ID/Reference #: [id number]  LETTER OF APPROVAL HAS BEEN SCANNED INTO MEDIA OF CHART

## 2024-06-03 NOTE — Telephone Encounter (Signed)
 No further action needed at this time.

## 2024-09-20 ENCOUNTER — Encounter: Admitting: Family Medicine
